# Patient Record
Sex: Female | Born: 1975 | Race: White | Hispanic: No | Marital: Married | Smoking: Never smoker
Health system: Southern US, Community
[De-identification: ages and names within clinical notes are randomized; demographics above are authoritative.]

## PROBLEM LIST (undated history)

## (undated) DIAGNOSIS — F419 Anxiety disorder, unspecified: Secondary | ICD-10-CM

## (undated) DIAGNOSIS — E079 Disorder of thyroid, unspecified: Secondary | ICD-10-CM

## (undated) DIAGNOSIS — O039 Complete or unspecified spontaneous abortion without complication: Secondary | ICD-10-CM

## (undated) DIAGNOSIS — E039 Hypothyroidism, unspecified: Secondary | ICD-10-CM

## (undated) DIAGNOSIS — D219 Benign neoplasm of connective and other soft tissue, unspecified: Secondary | ICD-10-CM

## (undated) HISTORY — DX: Anxiety disorder, unspecified: F41.9

## (undated) HISTORY — PX: UTERINE FIBROID EMBOLIZATION: SHX825

## (undated) HISTORY — DX: Complete or unspecified spontaneous abortion without complication: O03.9

## (undated) HISTORY — PX: LAPAROSCOPY: SHX197

## (undated) HISTORY — PX: MYOMECTOMY ABDOMINAL APPROACH: SUR870

## (undated) HISTORY — PX: ABDOMINAL HYSTERECTOMY: SHX81

## (undated) HISTORY — DX: Disorder of thyroid, unspecified: E07.9

## (undated) HISTORY — DX: Benign neoplasm of connective and other soft tissue, unspecified: D21.9

---

## 2005-02-27 ENCOUNTER — Ambulatory Visit: Payer: Self-pay

## 2005-12-19 ENCOUNTER — Ambulatory Visit: Payer: Self-pay

## 2006-03-18 ENCOUNTER — Encounter: Admission: RE | Admit: 2006-03-18 | Discharge: 2006-03-18 | Payer: Self-pay

## 2006-08-11 ENCOUNTER — Ambulatory Visit: Payer: Self-pay | Admitting: Family Medicine

## 2006-08-21 ENCOUNTER — Encounter: Admission: RE | Admit: 2006-08-21 | Discharge: 2006-08-21 | Payer: Self-pay | Admitting: Diagnostic Radiology

## 2007-08-19 ENCOUNTER — Ambulatory Visit: Payer: Self-pay | Admitting: Internal Medicine

## 2007-08-28 ENCOUNTER — Encounter: Admission: RE | Admit: 2007-08-28 | Discharge: 2007-08-28 | Payer: Self-pay | Admitting: Diagnostic Radiology

## 2008-02-15 ENCOUNTER — Encounter: Admission: RE | Admit: 2008-02-15 | Discharge: 2008-02-15 | Payer: Self-pay | Admitting: Diagnostic Radiology

## 2012-04-08 ENCOUNTER — Ambulatory Visit: Payer: Self-pay | Admitting: Internal Medicine

## 2013-09-30 ENCOUNTER — Encounter: Payer: Self-pay | Admitting: Maternal & Fetal Medicine

## 2013-10-06 ENCOUNTER — Ambulatory Visit: Payer: Self-pay | Admitting: Obstetrics & Gynecology

## 2013-10-06 LAB — CBC
HCT: 38.3 % (ref 35.0–47.0)
HGB: 12.9 g/dL (ref 12.0–16.0)
MCH: 32 pg (ref 26.0–34.0)
MCHC: 33.8 g/dL (ref 32.0–36.0)
MCV: 95 fL (ref 80–100)
Platelet: 239 10*3/uL (ref 150–440)
RBC: 4.04 10*6/uL (ref 3.80–5.20)
RDW: 13.5 % (ref 11.5–14.5)
WBC: 11.4 10*3/uL — AB (ref 3.6–11.0)

## 2014-04-20 LAB — HM PAP SMEAR: HM PAP: NEGATIVE

## 2014-10-29 NOTE — Consult Note (Signed)
Referral Information:  Reason for Referral 39 yoG2P0010 EDD 05/01/14 at 9w 4 days by ultrasound performed at [facility] on 09/08/13;measurements were consistent with 6w 3 days.  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   1. AMA--we addressed risks for aneuploidy and options for testing including invasive testing and cell free fetal DNA screening.  She had plans to undergo cell free fetal DNA screening in your office.  2. Hypothyroidism--we addressed the trophic nature of thyroid hormones and their role in fetal brain and cognitive development.  Recommend monitoring levels each trimester or one month after adjusting dose.  3. 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  Uterine artery ablation has been associated with miscarriage,  abnormal placentation and risk for postpartum  hemorrhage.   Recommendations I would recommend a follow up ultrasound at ~29-30 weeks to assess the placenta, it may be difficult to identify focal areas of invasion, however, this evaluation may provide reassurance if Nitabach's layer is clearly identfied.   Plan:  Prenatal Diagnosis Options Level II [place]   Ultrasound at what gestational ages Monthly > 28 weeks    Total Time Spent with Patient 45 minutes   >50% of visit spent in couseling/coordination of care yes   Office Use Only [address]  Level 3 (4min) NEW office consult detailed   Coding Description: MATERNAL CONDITIONS/HISTORY INDICATION(S).   Adv Maternal Age- primigravida.   Fibroids.  Electronic Signatures: Manfred Shirts (MD)  (Signed 26-Mar-15 11:56)  Authored: Referral, Home Medications, Allergies, Consult, Exam, Impression, Plan, Billing, Coding Description   Last Updated: 26-Mar-15 11:56 by Manfred Shirts (MD)

## 2016-06-03 ENCOUNTER — Other Ambulatory Visit: Payer: Self-pay | Admitting: Obstetrics & Gynecology

## 2016-06-03 DIAGNOSIS — Z1239 Encounter for other screening for malignant neoplasm of breast: Secondary | ICD-10-CM

## 2016-06-07 ENCOUNTER — Encounter: Payer: Self-pay | Admitting: Radiology

## 2016-06-07 ENCOUNTER — Ambulatory Visit
Admission: RE | Admit: 2016-06-07 | Discharge: 2016-06-07 | Disposition: A | Discharge status: Home / Self Care | Payer: BLUE CROSS/BLUE SHIELD | Source: Ambulatory Visit | Attending: Obstetrics & Gynecology | Admitting: Obstetrics & Gynecology

## 2016-06-07 DIAGNOSIS — Z1231 Encounter for screening mammogram for malignant neoplasm of breast: Secondary | ICD-10-CM | POA: Diagnosis present

## 2016-06-07 DIAGNOSIS — Z1239 Encounter for other screening for malignant neoplasm of breast: Secondary | ICD-10-CM

## 2016-06-07 LAB — HM MAMMOGRAPHY

## 2016-06-12 ENCOUNTER — Inpatient Hospital Stay
Admission: RE | Admit: 2016-06-12 | Discharge: 2016-06-12 | Disposition: A | Discharge status: Home / Self Care | Payer: Self-pay | Source: Ambulatory Visit | Attending: *Deleted | Admitting: *Deleted

## 2016-06-12 ENCOUNTER — Other Ambulatory Visit: Payer: Self-pay | Admitting: *Deleted

## 2016-06-12 DIAGNOSIS — Z1231 Encounter for screening mammogram for malignant neoplasm of breast: Secondary | ICD-10-CM

## 2016-07-12 ENCOUNTER — Ambulatory Visit: Payer: Self-pay

## 2017-06-04 ENCOUNTER — Ambulatory Visit (INDEPENDENT_AMBULATORY_CARE_PROVIDER_SITE_OTHER): Payer: BLUE CROSS/BLUE SHIELD | Admitting: Obstetrics & Gynecology

## 2017-06-04 ENCOUNTER — Encounter: Payer: Self-pay | Admitting: Obstetrics & Gynecology

## 2017-06-04 VITALS — BP 138/80 | Ht 66.0 in | Wt 136.0 lb

## 2017-06-04 DIAGNOSIS — Z1239 Encounter for other screening for malignant neoplasm of breast: Secondary | ICD-10-CM

## 2017-06-04 DIAGNOSIS — D219 Benign neoplasm of connective and other soft tissue, unspecified: Secondary | ICD-10-CM | POA: Diagnosis not present

## 2017-06-04 DIAGNOSIS — Z01419 Encounter for gynecological examination (general) (routine) without abnormal findings: Secondary | ICD-10-CM

## 2017-06-04 DIAGNOSIS — Z124 Encounter for screening for malignant neoplasm of cervix: Secondary | ICD-10-CM | POA: Diagnosis not present

## 2017-06-04 DIAGNOSIS — Z0000 Encounter for general adult medical examination without abnormal findings: Secondary | ICD-10-CM

## 2017-06-04 DIAGNOSIS — Z1231 Encounter for screening mammogram for malignant neoplasm of breast: Secondary | ICD-10-CM | POA: Diagnosis not present

## 2017-06-04 NOTE — Progress Notes (Signed)
HPI:      [name] is a 41 y.o. G2P0020 who LMP was No LMP recorded. Patient is not currently having periods (Reason: IUD)., she presents today for her annual examination. The patient has no complaints today. The patient is sexually active. Her last pap: approximate date 2015 and was normal and last mammogram: was normal. The patient does perform self breast exams.  There is no notable family history of breast or ovarian cancer in her family.  The patient has regular exercise: yes.  The patient denies current symptoms of depression.    GYN History: Contraception: IUD  PMHx: Past Medical History:  Diagnosis Date  . Anxiety   . Fibroids   . Spontaneous abortion    Past Surgical History:  Procedure Laterality Date  . LAPAROSCOPY    . UTERINE FIBROID EMBOLIZATION     Family History  Problem Relation Age of Onset  . COPD Father   . Breast cancer Neg Hx    Social History   Tobacco Use  . Smoking status: Never Smoker  . Smokeless tobacco: Never Used  Substance Use Topics  . Alcohol use: No    Frequency: Never  . Drug use: No    Current Outpatient Medications:  .  escitalopram (LEXAPRO) 10 MG tablet, , Disp: , Rfl: 0 .  levothyroxine (SYNTHROID, LEVOTHROID) 50 MCG tablet, , Disp: , Rfl: 0 Allergies: Promethazine  Review of Systems  Constitutional: Negative for chills, fever and malaise/fatigue.  HENT: Negative for congestion, sinus pain and sore throat.   Eyes: Negative for blurred vision and pain.  Respiratory: Negative for cough and wheezing.   Cardiovascular: Negative for chest pain and leg swelling.  Gastrointestinal: Negative for abdominal pain, constipation, diarrhea, heartburn, nausea and vomiting.  Genitourinary: Negative for dysuria, frequency, hematuria and urgency.  Musculoskeletal: Negative for back pain, joint pain, myalgias and neck pain.  Skin: Negative for itching and rash.  Neurological: Negative for dizziness, tremors and weakness.    Endo/Heme/Allergies: Does not bruise/bleed easily.  Psychiatric/Behavioral: Negative for depression. The patient is not nervous/anxious and does not have insomnia.    Objective: BP 138/80   Ht 5\' 6"  (1.676 m)   Wt 136 lb (61.7 kg)   BMI 21.95 kg/m   Filed Weights   06/04/17 1504  Weight: 136 lb (61.7 kg)   Body mass index is 21.95 kg/m. Physical Exam  Constitutional: She is oriented to person, place, and time. She appears well-developed and well-nourished. No distress.  Genitourinary: Rectum normal, vagina normal and uterus normal. Pelvic exam was performed with patient supine. There is no rash or lesion on the right labia. There is no rash or lesion on the left labia. Vagina exhibits no lesion. No bleeding in the vagina. Right adnexum does not display mass and does not display tenderness. Left adnexum does not display mass and does not display tenderness. Cervix does not exhibit motion tenderness, lesion, friability or polyp.   Uterus is mobile and midaxial. Uterus is not enlarged or exhibiting a mass.  Genitourinary Comments: Right sided fibroid 5 cm IUD strings 1 cm  HENT:  Head: Normocephalic and atraumatic. Head is without laceration.  Right Ear: Hearing normal.  Left Ear: Hearing normal.  Nose: No epistaxis.  No foreign bodies.  Mouth/Throat: Uvula is midline, oropharynx is clear and moist and mucous membranes are normal.  Eyes: Pupils are equal, round, and reactive to light.  Neck: Normal range of motion. Neck supple. No thyromegaly present.  Cardiovascular: Normal  rate and regular rhythm. Exam reveals no gallop and no friction rub.  No murmur heard. Pulmonary/Chest: Effort normal and breath sounds normal. No respiratory distress. She has no wheezes. Right breast exhibits no mass, no skin change and no tenderness. Left breast exhibits no mass, no skin change and no tenderness.  Abdominal: Soft. Bowel sounds are normal. She exhibits no distension. There is no tenderness. There  is no rebound.  Musculoskeletal: Normal range of motion.  Neurological: She is alert and oriented to person, place, and time. No cranial nerve deficit.  Skin: Skin is warm and dry.  Psychiatric: She has a normal mood and affect. Judgment normal.  Vitals reviewed.   Assessment:  ANNUAL EXAM 1. Annual physical exam   2. Screening for breast cancer   3. Screening for cervical cancer   4. Fibroid    Screening Plan:            1.  Cervical Screening-  Pap smear done today  2. Breast screening- Exam annually and mammogram>40 planned   3. Colonoscopy every 10 years, Hemoccult testing - after age 10  4. Labs managed by PCP  5. Counseling for contraception: IUD year 3, continue (no periods)  Other:  1. Annual physical exam  2. Screening for breast cancer - MM DIGITAL SCREENING BILATERAL; Future  3. Screening for cervical cancer - IGP, Aptima HPV  4. Fibroid uterus. Stable, no symptoms.    F/U  Return in about 1 year (around 06/04/2018) for Annual.  Barnett Applebaum, MD, [facility], [facility] 06/04/2017  3:57 PM

## 2017-06-04 NOTE — Patient Instructions (Signed)
PAP every three years Mammogram every year    Call [phone] to schedule at [facility] yearly (with PCP)   

## 2017-06-07 LAB — IGP, APTIMA HPV
HPV APTIMA: NEGATIVE
PAP SMEAR COMMENT: 0

## 2017-06-23 ENCOUNTER — Other Ambulatory Visit: Payer: Self-pay | Admitting: Internal Medicine

## 2017-06-24 ENCOUNTER — Ambulatory Visit
Admission: RE | Admit: 2017-06-24 | Discharge: 2017-06-24 | Disposition: A | Discharge status: Home / Self Care | Payer: BLUE CROSS/BLUE SHIELD | Source: Ambulatory Visit | Attending: Obstetrics & Gynecology | Admitting: Obstetrics & Gynecology

## 2017-06-24 DIAGNOSIS — Z1231 Encounter for screening mammogram for malignant neoplasm of breast: Secondary | ICD-10-CM | POA: Diagnosis present

## 2017-06-24 DIAGNOSIS — Z1239 Encounter for other screening for malignant neoplasm of breast: Secondary | ICD-10-CM

## 2017-12-02 ENCOUNTER — Ambulatory Visit: Payer: Self-pay | Admitting: Nurse Practitioner

## 2017-12-02 ENCOUNTER — Ambulatory Visit: Payer: BLUE CROSS/BLUE SHIELD | Admitting: Internal Medicine

## 2017-12-02 ENCOUNTER — Encounter: Payer: Self-pay | Admitting: Internal Medicine

## 2017-12-02 VITALS — BP 130/82 | HR 64 | Resp 16 | Ht 66.0 in | Wt 130.6 lb

## 2017-12-02 DIAGNOSIS — Z0001 Encounter for general adult medical examination with abnormal findings: Secondary | ICD-10-CM | POA: Diagnosis not present

## 2017-12-02 DIAGNOSIS — F411 Generalized anxiety disorder: Secondary | ICD-10-CM | POA: Diagnosis not present

## 2017-12-02 DIAGNOSIS — E039 Hypothyroidism, unspecified: Secondary | ICD-10-CM

## 2017-12-02 MED ORDER — ALPRAZOLAM 0.5 MG PO TABS: ORAL_TABLET | ORAL | 2 refills | Status: DC

## 2017-12-02 NOTE — Progress Notes (Signed)
[facility] [place], [place] [address]  Internal MEDICINE  Office Visit Note  Patient Name: Tiffany Frey  [id number]  [id number]  Date of Service: 12/02/2017  Chief Complaint  Patient presents with  . Hypothyroidism  . Anxiety    HPI  Pt is here for routine follow up. She feels well. Needs refill on her medications, she gets her pap/mammogram at obgyn. Does take Lexapro on a regular basis, takes Xanax prn    Current Medication: Outpatient Encounter Medications as of 12/02/2017  Medication Sig  . [DISCONTINUED] ALPRAZolam (XANAX) 0.5 MG tablet Take 0.5 mg by mouth. Take 1/2 to 1 tab po BID prn  . [DISCONTINUED] ALPRAZolam (XANAX) 0.5 MG tablet Take 1/2 to 1 tab po BID prn for anxiety  . ALPRAZolam (XANAX) 0.5 MG tablet Take HALF TAB PO BID PRN ANXIETY  . escitalopram (LEXAPRO) 10 MG tablet   . levothyroxine (SYNTHROID, LEVOTHROID) 50 MCG tablet TAKE ONE TABLET BY MOUTH IN THE MORNING 30  MINUTES  BEFORE  BREAKFAST   No facility-administered encounter medications on file as of 12/02/2017.     Surgical History: Past Surgical History:  Procedure Laterality Date  . LAPAROSCOPY    . UTERINE FIBROID EMBOLIZATION      Medical History: Past Medical History:  Diagnosis Date  . Anxiety   . Fibroids   . Spontaneous abortion     Family History: Family History  Problem Relation Age of Onset  . COPD Father   . Breast cancer Neg Hx     Social History   Socioeconomic History  . Marital status: Married    Spouse name: Not on file  . Number of children: Not on file  . Years of education: Not on file  . Highest education level: Not on file  Occupational History  . Not on file  Social Needs  . Financial resource strain: Not on file  . Food insecurity:    Worry: Not on file    Inability: Not on file  . Transportation needs:    Medical: Not on file    Non-medical: Not on file  Tobacco Use  . Smoking status: Never Smoker  . Smokeless  tobacco: Never Used  Substance and Sexual Activity  . Alcohol use: No    Frequency: Never  . Drug use: No  . Sexual activity: Yes    Birth control/protection: IUD  Lifestyle  . Physical activity:    Days per week: Not on file    Minutes per session: Not on file  . Stress: Not on file  Relationships  . Social connections:    Talks on phone: Not on file    Gets together: Not on file    Attends religious service: Not on file    Active member of club or organization: Not on file    Attends meetings of clubs or organizations: Not on file    Relationship status: Not on file  . Intimate partner violence:    Fear of current or ex partner: Not on file    Emotionally abused: Not on file    Physically abused: Not on file    Forced sexual activity: Not on file  Other Topics Concern  . Not on file  Social History Narrative  . Not on file    Review of Systems  Constitutional: Negative for chills.  HENT: Negative for ear pain and postnasal drip.   Eyes: Negative.  Negative for visual disturbance.  Respiratory: Negative for cough  and wheezing.   Cardiovascular: Negative for palpitations and leg swelling.  Gastrointestinal: Negative for constipation, diarrhea and nausea.  Genitourinary: Negative for dysuria and flank pain.  Musculoskeletal: Negative for back pain and neck pain.  Skin: Negative for color change.  Allergic/Immunologic: Positive for environmental allergies. Negative for food allergies.  Neurological: Negative for headaches.  Hematological: Does not bruise/bleed easily.  Psychiatric/Behavioral: Negative for agitation, behavioral problems (depression) and hallucinations.    Vital Signs: BP 130/82 (BP Location: Left Arm, Patient Position: Sitting, Cuff Size: Small)   Pulse 64   Resp 16   Ht 5\' 6"  (1.676 m)   Wt 130 lb 9.6 oz (59.2 kg)   SpO2 100%   BMI 21.08 kg/m    Physical Exam  Constitutional: She is oriented to person, place, and time. She appears  well-developed and well-nourished. No distress.  HENT:  Head: Normocephalic.  Eyes: Pupils are equal, round, and reactive to light. EOM are normal.  Cardiovascular: Normal rate and regular rhythm.  Pulmonary/Chest: Effort normal and breath sounds normal.  Neurological: She is alert and oriented to person, place, and time.  Skin: Skin is warm and dry.   Assessment/plan. 1. Hypothyroidism, unspecified type - Continue Synthroid  - TSH - T4, free  2. Generalized anxiety disorder - Refilled Xanax and Lexapro  Labs ordered  - Lipid Panel With LDL/HDL Ratio - Comprehensive metabolic panel  General Counseling: Raye verbalizes understanding of the findings of todays visit and agrees with plan of treatment. I have discussed any further diagnostic evaluation that may be needed or ordered today. We also reviewed her medications today. she has been encouraged to call the office with any questions or concerns that should arise related to todays visit.   Orders Placed This Encounter  Procedures  . Lipid Panel With LDL/HDL Ratio  . TSH  . T4, free  . Comprehensive metabolic panel    Meds ordered this encounter  Medications  . DISCONTD: ALPRAZolam (XANAX) 0.5 MG tablet    Sig: Take 1/2 to 1 tab po BID prn for anxiety    Dispense:  45 tablet    Refill:  2  . ALPRAZolam (XANAX) 0.5 MG tablet    Sig: Take HALF TAB PO BID PRN ANXIETY    Dispense:  45 tablet    Refill:  2    Time spent:20Minutes   Dr Lavera Guise Internal medicine

## 2017-12-05 LAB — COMPREHENSIVE METABOLIC PANEL
A/G RATIO: 1.9 (ref 1.2–2.2)
ALK PHOS: 57 IU/L (ref 39–117)
ALT: 12 IU/L (ref 0–32)
AST: 17 IU/L (ref 0–40)
Albumin: 4.8 g/dL (ref 3.5–5.5)
BILIRUBIN TOTAL: 0.7 mg/dL (ref 0.0–1.2)
BUN / CREAT RATIO: 16 (ref 9–23)
BUN: 17 mg/dL (ref 6–24)
CHLORIDE: 103 mmol/L (ref 96–106)
CO2: 24 mmol/L (ref 20–29)
Calcium: 9.8 mg/dL (ref 8.7–10.2)
Creatinine, Ser: 1.05 mg/dL — ABNORMAL HIGH (ref 0.57–1.00)
GFR calc non Af Amer: 65 mL/min/{1.73_m2} (ref >59)
GFR, EST AFRICAN AMERICAN: 75 mL/min/{1.73_m2} (ref >59)
GLOBULIN, TOTAL: 2.5 g/dL (ref 1.5–4.5)
Glucose: 90 mg/dL (ref 65–99)
Potassium: 5 mmol/L (ref 3.5–5.2)
SODIUM: 140 mmol/L (ref 134–144)
TOTAL PROTEIN: 7.3 g/dL (ref 6.0–8.5)

## 2017-12-05 LAB — CBC WITH DIFFERENTIAL/PLATELET
Basophils Absolute: 0 10*3/uL (ref 0.0–0.2)
Basos: 1 %
EOS (ABSOLUTE): 0.2 10*3/uL (ref 0.0–0.4)
Eos: 2 %
HEMATOCRIT: 42.2 % (ref 34.0–46.6)
HEMOGLOBIN: 14.6 g/dL (ref 11.1–15.9)
Immature Grans (Abs): 0 10*3/uL (ref 0.0–0.1)
Immature Granulocytes: 0 %
LYMPHS ABS: 1.5 10*3/uL (ref 0.7–3.1)
Lymphs: 22 %
MCH: 32.2 pg (ref 26.6–33.0)
MCHC: 34.6 g/dL (ref 31.5–35.7)
MCV: 93 fL (ref 79–97)
MONOCYTES: 9 %
MONOS ABS: 0.6 10*3/uL (ref 0.1–0.9)
NEUTROS ABS: 4.6 10*3/uL (ref 1.4–7.0)
Neutrophils: 66 %
Platelets: 270 10*3/uL (ref 150–450)
RBC: 4.53 x10E6/uL (ref 3.77–5.28)
RDW: 14.1 % (ref 12.3–15.4)
WBC: 6.9 10*3/uL (ref 3.4–10.8)

## 2017-12-05 LAB — LIPID PANEL WITH LDL/HDL RATIO
Cholesterol, Total: 166 mg/dL (ref 100–199)
HDL: 64 mg/dL (ref >39)
LDL CALC: 87 mg/dL (ref 0–99)
LDl/HDL Ratio: 1.4 ratio (ref 0.0–3.2)
TRIGLYCERIDES: 76 mg/dL (ref 0–149)
VLDL CHOLESTEROL CAL: 15 mg/dL (ref 5–40)

## 2017-12-05 LAB — T4, FREE: Free T4: 1.34 ng/dL (ref 0.82–1.77)

## 2017-12-05 LAB — TSH: TSH: 2.62 u[IU]/mL (ref 0.450–4.500)

## 2017-12-11 ENCOUNTER — Telehealth: Payer: Self-pay | Admitting: General Practice

## 2017-12-11 NOTE — Telephone Encounter (Signed)
Pt called and states that she did not hear about her lab work , and she could not see it on my chart. Told pt that I would call her back this afternoon with result

## 2017-12-11 NOTE — Telephone Encounter (Signed)
Pt advised on labs and to increase water intake

## 2018-07-06 ENCOUNTER — Telehealth: Payer: Self-pay | Admitting: Obstetrics & Gynecology

## 2018-07-06 ENCOUNTER — Ambulatory Visit (INDEPENDENT_AMBULATORY_CARE_PROVIDER_SITE_OTHER): Payer: BLUE CROSS/BLUE SHIELD | Admitting: Obstetrics & Gynecology

## 2018-07-06 ENCOUNTER — Encounter: Payer: Self-pay | Admitting: Obstetrics & Gynecology

## 2018-07-06 VITALS — BP 110/70 | Ht 66.0 in | Wt 134.0 lb

## 2018-07-06 DIAGNOSIS — Z01419 Encounter for gynecological examination (general) (routine) without abnormal findings: Secondary | ICD-10-CM | POA: Diagnosis not present

## 2018-07-06 DIAGNOSIS — D219 Benign neoplasm of connective and other soft tissue, unspecified: Secondary | ICD-10-CM

## 2018-07-06 DIAGNOSIS — Z1239 Encounter for other screening for malignant neoplasm of breast: Secondary | ICD-10-CM

## 2018-07-06 NOTE — Progress Notes (Signed)
HPI:      [name] is a 42 y.o. G2P0020 with Menstrual status: IUD (year 4+), she presents today for her annual examination. The patient has no complaints today.  She also has h/o fibroid uterus. The patient is sexually active. Her last pap: approximate date 2018 and was normal and last mammogram: approximate date 2018 and was normal. The patient does perform self breast exams.  There is no notable family history of breast or ovarian cancer in her family.  The patient has regular exercise: yes.  The patient denies current symptoms of depression.    GYN History: Contraception: IUD  PMHx: Past Medical History:  Diagnosis Date  . Anxiety   . Fibroids   . Spontaneous abortion    Past Surgical History:  Procedure Laterality Date  . LAPAROSCOPY    . UTERINE FIBROID EMBOLIZATION     Family History  Problem Relation Age of Onset  . COPD Father   . Breast cancer Neg Hx    Social History   Tobacco Use  . Smoking status: Never Smoker  . Smokeless tobacco: Never Used  Substance Use Topics  . Alcohol use: No    Frequency: Never  . Drug use: No    Current Outpatient Medications:  .  ALPRAZolam (XANAX) 0.5 MG tablet, Take HALF TAB PO BID PRN ANXIETY, Disp: 45 tablet, Rfl: 2 .  escitalopram (LEXAPRO) 10 MG tablet, , Disp: , Rfl: 0 .  levonorgestrel (MIRENA) 20 MCG/24HR IUD, 1 each by Intrauterine route once., Disp: , Rfl:  .  levothyroxine (SYNTHROID, LEVOTHROID) 50 MCG tablet, TAKE ONE TABLET BY MOUTH IN THE MORNING 30  MINUTES  BEFORE  BREAKFAST, Disp: 90 tablet, Rfl: 3 Allergies: Promethazine  Review of Systems  Constitutional: Negative for chills, fever and malaise/fatigue.  HENT: Negative for congestion, sinus pain and sore throat.   Eyes: Negative for blurred vision and pain.  Respiratory: Negative for cough and wheezing.   Cardiovascular: Negative for chest pain and leg swelling.  Gastrointestinal: Negative for abdominal pain, constipation, diarrhea, heartburn,  nausea and vomiting.  Genitourinary: Negative for dysuria, frequency, hematuria and urgency.  Musculoskeletal: Negative for back pain, joint pain, myalgias and neck pain.  Skin: Negative for itching and rash.  Neurological: Negative for dizziness, tremors and weakness.  Endo/Heme/Allergies: Does not bruise/bleed easily.  Psychiatric/Behavioral: Negative for depression. The patient is not nervous/anxious and does not have insomnia.    Objective: BP 110/70   Ht 5\' 6"  (1.676 m)   Wt 134 lb (60.8 kg)   BMI 21.63 kg/m   Filed Weights   07/06/18 0806  Weight: 134 lb (60.8 kg)   Body mass index is 21.63 kg/m. Physical Exam Constitutional:      General: She is not in acute distress.    Appearance: She is well-developed.  Genitourinary:     Pelvic exam was performed with patient supine.     Vagina and rectum normal.     No lesions in the vagina.     No vaginal bleeding.     No cervical motion tenderness, friability, lesion or polyp.     Uterus is mobile.     Uterus is not enlarged.     Uterine mass present.    Uterus is midaxial.     No right or left adnexal mass present.     Right adnexa not tender.     Left adnexa not tender.     Genitourinary Comments: Strings 2 cm Palpables small fibroid  right sided  HENT:     Head: Normocephalic and atraumatic. No laceration.     Right Ear: Hearing normal.     Left Ear: Hearing normal.     Mouth/Throat:     Pharynx: Uvula midline.  Eyes:     Pupils: Pupils are equal, round, and reactive to light.  Neck:     Musculoskeletal: Normal range of motion and neck supple.     Thyroid: No thyromegaly.  Cardiovascular:     Rate and Rhythm: Normal rate and regular rhythm.     Heart sounds: No murmur. No friction rub. No gallop.   Pulmonary:     Effort: Pulmonary effort is normal. No respiratory distress.     Breath sounds: Normal breath sounds. No wheezing.  Chest:     Breasts:        Right: No mass, skin change or tenderness.        Left:  No mass, skin change or tenderness.  Abdominal:     General: Bowel sounds are normal. There is no distension.     Palpations: Abdomen is soft.     Tenderness: There is no abdominal tenderness. There is no rebound.  Musculoskeletal: Normal range of motion.  Neurological:     Mental Status: She is alert and oriented to person, place, and time.     Cranial Nerves: No cranial nerve deficit.  Skin:    General: Skin is warm and dry.  Psychiatric:        Judgment: Judgment normal.  Vitals signs reviewed.   Assessment:  ANNUAL EXAM 1. Women's annual routine gynecological examination   2. Screening for breast cancer   3. Fibroid    Screening Plan:            1.  Cervical Screening-  Pap smear schedule reviewed with patient  2. Breast screening- Exam annually and mammogram>40 planned   3. Colonoscopy every 10 years, Hemoccult testing - after age 47  4. Labs managed by PCP  5. Counseling for contraception: IUD, year 4+    F/U  Return in about 4 months (around 11/05/2018) for Follow up for IUD EXCHANGE.  Barnett Applebaum, MD, [facility], [facility] 07/06/2018  8:26 AM

## 2018-07-06 NOTE — Telephone Encounter (Signed)
Patient scheduled for Mirena replacement 11/09/18 with [facility]

## 2018-07-06 NOTE — Patient Instructions (Signed)
PAP every three years Mammogram every year    Call [phone] to schedule at [facility] yearly (with PCP)   

## 2018-07-16 ENCOUNTER — Other Ambulatory Visit: Payer: Self-pay

## 2018-07-16 MED ORDER — LEVOTHYROXINE SODIUM 50 MCG PO TABS: ORAL_TABLET | ORAL | 1 refills | Status: DC

## 2018-07-24 ENCOUNTER — Ambulatory Visit
Admission: RE | Admit: 2018-07-24 | Discharge: 2018-07-24 | Disposition: A | Discharge status: Home / Self Care | Payer: PRIVATE HEALTH INSURANCE | Source: Ambulatory Visit | Attending: Obstetrics & Gynecology | Admitting: Obstetrics & Gynecology

## 2018-07-24 DIAGNOSIS — Z1239 Encounter for other screening for malignant neoplasm of breast: Secondary | ICD-10-CM | POA: Insufficient documentation

## 2018-08-14 ENCOUNTER — Ambulatory Visit (INDEPENDENT_AMBULATORY_CARE_PROVIDER_SITE_OTHER): Payer: PRIVATE HEALTH INSURANCE | Admitting: Nurse Practitioner

## 2018-08-14 VITALS — BP 133/66 | HR 67 | Temp 98.1°F | Ht 65.0 in | Wt 138.0 lb

## 2018-08-14 DIAGNOSIS — F419 Anxiety disorder, unspecified: Secondary | ICD-10-CM | POA: Diagnosis not present

## 2018-08-14 DIAGNOSIS — B353 Tinea pedis: Secondary | ICD-10-CM

## 2018-08-14 DIAGNOSIS — Z7689 Persons encountering health services in other specified circumstances: Secondary | ICD-10-CM

## 2018-08-14 DIAGNOSIS — E039 Hypothyroidism, unspecified: Secondary | ICD-10-CM | POA: Diagnosis not present

## 2018-08-17 ENCOUNTER — Encounter: Payer: Self-pay | Admitting: Nurse Practitioner

## 2018-08-17 DIAGNOSIS — F419 Anxiety disorder, unspecified: Secondary | ICD-10-CM | POA: Insufficient documentation

## 2018-08-17 DIAGNOSIS — E039 Hypothyroidism, unspecified: Secondary | ICD-10-CM | POA: Insufficient documentation

## 2018-08-17 MED ORDER — HYDROXYZINE HCL 10 MG PO TABS: 10.0000 mg | ORAL_TABLET | Freq: Three times a day (TID) | ORAL | 0 refills | Status: DC | PRN

## 2018-08-17 NOTE — Patient Instructions (Addendum)
Tiffany Frey,   Thank you for coming in to clinic today.  Please schedule a follow-up appointment with Cassell Smiles, AGNP. Return in about 6 months (around 02/12/2019) for annual physical AND 1 year anxiety, thyroid.  If you have any other questions or concerns, please feel free to call the clinic or send a message through [place] [facility]. You may also schedule an earlier appointment if necessary.  You will receive a survey after today's visit either digitally by e-mail or paper by [employer]. Your experiences and feedback matter to [place].  Please respond so we know how we are doing as we provide care for you.   Cassell Smiles, DNP, AGNP-BC Adult Gerontology Nurse Practitioner [place]

## 2018-08-17 NOTE — Progress Notes (Signed)
Subjective:    Patient ID: Tiffany Frey, female    DOB: January 23, 1976, 43 y.o.   MRN: [id number]  Tiffany Frey is a 43 y.o. female presenting on 08/14/2018 for No chief complaint on file.   HPI Establish Care New Provider Pt last seen by PCP Dr. Humphrey Rolls at [facility] about 1 year ago.  Obtain records for last 2 years. Patient is changing due to provider no longer in health insurance network.  Anxiety Patient continues to take escitalopram 10 mg tablet  (1/2 tab for 5 mg total) once daily in evening.  She also has used alprazolam 0.5 mg tab (1/2 tab for 0.25 mg total) prn for panic.  Most often happens when riding in a car.  Patient has other generalized anxiousness/nervousness that does not normally effect daily life.  Feels controlled on current dose of escitalopram.  Patient has not use alprazolam recently.  Is willing to try alternative medication for panic.  Thyroid Patient has hypothyroidism.  Last labs 09/2017.  Takes levothyrxine 50 mcg once daily in am on an empty stomach. - She denies, fatigue, excess energy, weight changes, heart racing, heart palpitations, heat and cold intolerance, changes in hair/skin/nails, and lower leg swelling.  - She does not have any compressive symptoms to include difficulty swallowing, globus sensation, or difficulty breathing when lying flat.   Tinea pedis and fungal nail infection Patient has had ketoconazole cream for tinea pedis of foot.  Managed by Dr. Nehemiah Massed.  Uses ketoconazole 2-3 days per week. Continues to use Kerydin 5% nail treatment daily and will finish in March.  Patient notes nearly full resolution but continues treatment for full year.  Depression screen [facility]/9 08/17/2018 12/02/2017  Decreased Interest 0 0  Down, Depressed, Hopeless 0 0  PHQ - 2 Score 0 0  Altered sleeping 0 -  Tired, decreased energy 0 -  Change in appetite 0 -  Feeling bad or failure about yourself  0 -  Trouble concentrating 0 -  Moving slowly or  fidgety/restless 0 -  Suicidal thoughts 0 -  PHQ-9 Score 0 -  Difficult doing work/chores Not difficult at all -    Social History   Tobacco Use  . Smoking status: Never Smoker  . Smokeless tobacco: Never Used  Substance Use Topics  . Alcohol use: No    Frequency: Never  . Drug use: No    Review of Systems  Constitutional: Negative for chills and fever.  HENT: Negative for congestion and sore throat.   Eyes: Negative for pain.  Respiratory: Negative for cough, shortness of breath and wheezing.   Cardiovascular: Negative for chest pain, palpitations and leg swelling.  Gastrointestinal: Negative for abdominal pain, blood in stool, constipation, diarrhea, nausea and vomiting.  Endocrine: Negative for polydipsia.  Genitourinary: Negative for dysuria, frequency, hematuria and urgency.  Musculoskeletal: Negative for back pain, myalgias and neck pain.  Skin: Negative.  Negative for rash.  Allergic/Immunologic: Negative for environmental allergies.  Neurological: Negative for dizziness, weakness and headaches.  Hematological: Does not bruise/bleed easily.  Psychiatric/Behavioral: Negative for dysphoric mood and suicidal ideas. The patient is nervous/anxious.    Per HPI unless specifically indicated above     Objective:    There were no vitals taken for this visit.  Wt Readings from Last 3 Encounters:  07/06/18 134 lb (60.8 kg)  12/02/17 130 lb 9.6 oz (59.2 kg)  06/04/17 136 lb (61.7 kg)    Physical Exam Vitals signs reviewed.  Constitutional:  General: She is not in acute distress.    Appearance: She is well-developed.  HENT:     Head: Normocephalic and atraumatic.     Mouth/Throat:     Mouth: Mucous membranes are moist.     Pharynx: Oropharynx is clear.  Eyes:     Extraocular Movements: Extraocular movements intact.     Conjunctiva/sclera: Conjunctivae normal.     Pupils: Pupils are equal, round, and reactive to light.  Neck:     Musculoskeletal: Normal range of  motion and neck supple.     Thyroid: Thyroid mass, thyromegaly and thyroid tenderness present.  Cardiovascular:     Rate and Rhythm: Normal rate and regular rhythm.     Pulses: Normal pulses.          Radial pulses are 2+ on the right side and 2+ on the left side.       Posterior tibial pulses are 2+ on the right side and 2+ on the left side.     Heart sounds: Normal heart sounds, S1 normal and S2 normal.  Pulmonary:     Effort: Pulmonary effort is normal. No respiratory distress.     Breath sounds: Normal breath sounds and air entry.  Musculoskeletal:     Right lower leg: No edema.     Left lower leg: No edema.  Skin:    General: Skin is warm and dry.     Capillary Refill: Capillary refill takes less than 2 seconds.  Neurological:     General: No focal deficit present.     Mental Status: She is alert and oriented to person, place, and time. Mental status is at baseline.  Psychiatric:        Attention and Perception: Attention normal.        Mood and Affect: Affect normal. Mood is anxious.        Behavior: Behavior normal. Behavior is cooperative.        Thought Content: Thought content normal.        Judgment: Judgment normal.      Results for orders placed or performed in visit on 12/02/17  Lipid Panel With LDL/HDL Ratio  Result Value Ref Range   Cholesterol, Total 166 100 - 199 mg/dL   Triglycerides 76 0 - 149 mg/dL   HDL 64 >39 mg/dL   VLDL Cholesterol Cal 15 5 - 40 mg/dL   LDL Calculated 87 0 - 99 mg/dL   LDl/HDL Ratio 1.4 0.0 - 3.2 ratio  TSH  Result Value Ref Range   TSH 2.620 0.450 - 4.500 uIU/mL  T4, free  Result Value Ref Range   Free T4 1.34 0.82 - 1.77 ng/dL  Comprehensive metabolic panel  Result Value Ref Range   Glucose 90 65 - 99 mg/dL   BUN 17 6 - 24 mg/dL   Creatinine, Ser 1.05 (H) 0.57 - 1.00 mg/dL   GFR calc non Af Amer 65 >59 mL/min/1.73   GFR calc Af Amer 75 >59 mL/min/1.73   BUN/Creatinine Ratio 16 9 - 23   Sodium 140 134 - 144 mmol/L    Potassium 5.0 3.5 - 5.2 mmol/L   Chloride 103 96 - 106 mmol/L   CO2 24 20 - 29 mmol/L   Calcium 9.8 8.7 - 10.2 mg/dL   Total Protein 7.3 6.0 - 8.5 g/dL   Albumin 4.8 3.5 - 5.5 g/dL   Globulin, Total 2.5 1.5 - 4.5 g/dL   Albumin/Globulin Ratio 1.9 1.2 - 2.2   Bilirubin Total 0.7  0.0 - 1.2 mg/dL   Alkaline Phosphatase 57 39 - 117 IU/L   AST 17 0 - 40 IU/L   ALT 12 0 - 32 IU/L  CBC with Differential/Platelet  Result Value Ref Range   WBC 6.9 3.4 - 10.8 x10E3/uL   RBC 4.53 3.77 - 5.28 x10E6/uL   Hemoglobin 14.6 11.1 - 15.9 g/dL   Hematocrit 42.2 34.0 - 46.6 %   MCV 93 79 - 97 fL   MCH 32.2 26.6 - 33.0 pg   MCHC 34.6 31.5 - 35.7 g/dL   RDW 14.1 12.3 - 15.4 %   Platelets 270 150 - 450 x10E3/uL   Neutrophils 66 Not Estab. %   Lymphs 22 Not Estab. %   Monocytes 9 Not Estab. %   Eos 2 Not Estab. %   Basos 1 Not Estab. %   Neutrophils Absolute 4.6 1.4 - 7.0 x10E3/uL   Lymphocytes Absolute 1.5 0.7 - 3.1 x10E3/uL   Monocytes Absolute 0.6 0.1 - 0.9 x10E3/uL   EOS (ABSOLUTE) 0.2 0.0 - 0.4 x10E3/uL   Basophils Absolute 0.0 0.0 - 0.2 x10E3/uL   Immature Granulocytes 0 Not Estab. %   Immature Grans (Abs) 0.0 0.0 - 0.1 x10E3/uL      Assessment & Plan:   Problem List Items Addressed This Visit      Endocrine   Hypothyroidism Currently and previously stable.  No complications noted on exam today.  Needs labs. TSH ordered today.  Continue current dose of levothyroxine without changes until labs. Follow-up 6 months with labs again at annual physical.     Other   Anxiety - Primary Patient with known GAD.  Uses rescue for panic attack rarely.  Controlled on escitalopram 5 mg once daily and has good access to coping skills usually (except in car while riding - not driving).  Plan: 1. Continue escitalopram 5 mg once daily. 2. STOP alprazolam 3. START hydroxyzine 10-20 mg prn tid anxiety/panic.  Cautioned drowsiness 4. Follow-up 6 months.   Relevant Medications   hydrOXYzine  (ATARAX/VISTARIL) 10 MG tablet    Other Visit Diagnoses    Encounter to establish care     Previous PCP was at Dr. Humphrey Rolls ([place]).  Records will be requested.  Past medical, family, and surgical history reviewed w/ pt.     Tinea pedis of right foot     Resolving.  Complete course of treatment per Dr. Nehemiah Massed.  Follow-up prn.   Relevant Medications   ketoconazole (NIZORAL) 2 % cream   KERYDIN 5 % SOLN      Meds ordered this encounter  Medications  . hydrOXYzine (ATARAX/VISTARIL) 10 MG tablet    Sig: Take 1-2 tablets (10-20 mg total) by mouth 3 (three) times daily as needed for anxiety.    Dispense:  30 tablet    Refill:  0    Order Specific Question:   Supervising Provider    Answer:   Olin Hauser [[id number]]    Follow up plan: Return in about 6 months (around 02/12/2019) for annual physical AND 1 year anxiety, thyroid.  Cassell Smiles, DNP, AGPCNP-BC Adult Gerontology Primary Care Nurse Practitioner [facility] 08/17/2018, 11:36 AM

## 2018-08-20 ENCOUNTER — Other Ambulatory Visit: Payer: Self-pay | Admitting: Nurse Practitioner

## 2018-08-20 DIAGNOSIS — E039 Hypothyroidism, unspecified: Secondary | ICD-10-CM

## 2018-08-20 LAB — TSH: TSH: 1.45 mIU/L

## 2018-08-20 MED ORDER — LEVOTHYROXINE SODIUM 50 MCG PO TABS: ORAL_TABLET | ORAL | 1 refills | Status: DC

## 2018-09-22 ENCOUNTER — Other Ambulatory Visit: Payer: Self-pay | Admitting: Internal Medicine

## 2018-09-23 ENCOUNTER — Other Ambulatory Visit: Payer: Self-pay | Admitting: Internal Medicine

## 2018-09-25 ENCOUNTER — Other Ambulatory Visit: Payer: Self-pay | Admitting: Nurse Practitioner

## 2018-09-25 DIAGNOSIS — F419 Anxiety disorder, unspecified: Secondary | ICD-10-CM

## 2018-09-25 MED ORDER — ESCITALOPRAM OXALATE 10 MG PO TABS: 5.0000 mg | ORAL_TABLET | Freq: Every day | ORAL | 1 refills | Status: DC

## 2018-09-25 NOTE — Telephone Encounter (Signed)
Pt needs a 90 day refill on escitalopram sent to [facility].  Her call back [phone]

## 2018-10-08 NOTE — Telephone Encounter (Signed)
Patient is reschedule to 11/12/18 with [facility]

## 2018-11-09 ENCOUNTER — Ambulatory Visit: Payer: Self-pay | Admitting: Obstetrics & Gynecology

## 2018-11-12 ENCOUNTER — Encounter: Payer: Self-pay | Admitting: Obstetrics & Gynecology

## 2018-11-12 ENCOUNTER — Ambulatory Visit (INDEPENDENT_AMBULATORY_CARE_PROVIDER_SITE_OTHER): Payer: PRIVATE HEALTH INSURANCE | Admitting: Obstetrics & Gynecology

## 2018-11-12 ENCOUNTER — Other Ambulatory Visit: Payer: Self-pay

## 2018-11-12 VITALS — BP 120/80 | Ht 66.0 in | Wt 139.0 lb

## 2018-11-12 DIAGNOSIS — Z30433 Encounter for removal and reinsertion of intrauterine contraceptive device: Secondary | ICD-10-CM

## 2018-11-12 NOTE — Progress Notes (Signed)
  History of Present Illness:  Tiffany Frey is a 43 y.o. that had a Mirena IUD placed approximately 5 years ago. Since that time, she states that no periods and good birth control; wants an exchange.  The following portions of the patient's history were reviewed and updated as appropriate: allergies, current medications, past family history, past medical history, past social history, past surgical history and problem list.  Patient Active Problem List   Diagnosis Date Noted  . Anxiety 08/17/2018  . Hypothyroidism 08/17/2018  . Fibroid 06/04/2017   Medications:  Current Outpatient Medications on File Prior to Visit  Medication Sig Dispense Refill  . ALPRAZolam (XANAX) 0.5 MG tablet Take HALF TAB PO BID PRN ANXIETY 45 tablet 2  . escitalopram (LEXAPRO) 10 MG tablet Take 0.5 tablets (5 mg total) by mouth daily. 45 tablet 1  . hydrOXYzine (ATARAX/VISTARIL) 10 MG tablet Take 1-2 tablets (10-20 mg total) by mouth 3 (three) times daily as needed for anxiety. 30 tablet 0  . KERYDIN 5 % SOLN Apply 1 application topically daily.    Marland Kitchen ketoconazole (NIZORAL) 2 % cream Apply 1 application topically every other day.    . levonorgestrel (MIRENA) 20 MCG/24HR IUD 1 each by Intrauterine route once.    Marland Kitchen levothyroxine (SYNTHROID, LEVOTHROID) 50 MCG tablet TAKE ONE TABLET BY MOUTH IN THE MORNING 30  MINUTES  BEFORE  BREAKFAST 90 tablet 1   No current facility-administered medications on file prior to visit.    Allergies: is allergic to promethazine.  Physical Exam:  BP 120/80   Ht 5\' 6"  (1.676 m)   Wt 139 lb (63 kg)   BMI 22.44 kg/m  Body mass index is 22.44 kg/m. Constitutional: Well nourished, well developed female in no acute distress.  Abdomen: diffusely non tender to palpation, non distended, and no masses, hernias Neuro: Grossly intact Psych:  Normal mood and affect.    Pelvic exam:  Two IUD strings present seen coming from the cervical os. EGBUS, vaginal vault and cervix: within normal  limits  IUD Removal Strings of IUD identified and grasped.  IUD removed without problem.  Pt tolerated this well.  IUD noted to be intact.  IUD Insertion Procedure Note Patient identified, informed consent performed, consent signed.   Discussed risks of irregular bleeding, cramping, infection, malpositioning or misplacement of the IUD outside the uterus which may require further procedure such as laparoscopy, risk of failure <1%. Time out was performed.    A bimanual exam showed the uterus to be midposition.  Speculum placed in the vagina.  Cervix visualized.  Cleaned with Betadine x 2.  Grasped anteriorly with a single tooth tenaculum.  Uterus sounded to 8 cm.   IUD placed per manufacturer's recommendations.  Strings trimmed to 3 cm. Tenaculum was removed, good hemostasis noted.  Patient tolerated procedure well.   Patient was given post-procedure instructions.  She was advised to have backup contraception for one week.  Patient was also asked to check IUD strings periodically and follow up in 4 weeks for IUD check.  Barnett Applebaum, MD, [facility], [facility] 11/12/2018  2:49 PM

## 2018-11-12 NOTE — Patient Instructions (Signed)
Intrauterine Device Insertion, Care After    This sheet gives you information about how to care for yourself after your procedure. Your health care provider may also give you more specific instructions. If you have problems or questions, contact your health care provider.  What can I expect after the procedure?  After the procedure, it is common to have:  · Cramps and pain in the abdomen.  · Light bleeding (spotting) or heavier bleeding that is like your menstrual period. This may last for up to a few days.  · Lower back pain.  · Dizziness.  · Headaches.  · Nausea.  Follow these instructions at home:  · Before resuming sexual activity, check to make sure that you can feel the IUD string(s). You should be able to feel the end of the string(s) below the opening of your cervix. If your IUD string is in place, you may resume sexual activity.  ? If you had a hormonal IUD inserted more than 7 days after your most recent period started, you will need to use a backup method of birth control for 7 days after IUD insertion. Ask your health care provider whether this applies to you.  · Continue to check that the IUD is still in place by feeling for the string(s) after every menstrual period, or once a month.  · Take over-the-counter and prescription medicines only as told by your health care provider.  · Do not drive or use heavy machinery while taking prescription pain medicine.  · Keep all follow-up visits as told by your health care provider. This is important.  Contact a health care provider if:  · You have bleeding that is heavier or lasts longer than a normal menstrual cycle.  · You have a fever.  · You have cramps or abdominal pain that get worse or do not get better with medicine.  · You develop abdominal pain that is new or is not in the same area of earlier cramping and pain.  · You feel lightheaded or weak.  · You have abnormal or bad-smelling discharge from your vagina.  · You have pain during sexual  activity.  · You have any of the following problems with your IUD string(s):  ? The string bothers or hurts you or your sexual partner.  ? You cannot feel the string.  ? The string has gotten longer.  · You can feel the IUD in your vagina.  · You think you may be pregnant, or you miss your menstrual period.  · You think you may have an STI (sexually transmitted infection).  Get help right away if:  · You have flu-like symptoms.  · You have a fever and chills.  · You can feel that your IUD has slipped out of place.  Summary  · After the procedure, it is common to have cramps and pain in the abdomen. It is also common to have light bleeding (spotting) or heavier bleeding that is like your menstrual period.  · Continue to check that the IUD is still in place by feeling for the string(s) after every menstrual period, or once a month.  · Keep all follow-up visits as told by your health care provider. This is important.  · Contact your health care provider if you have problems with your IUD string(s), such as the string getting longer or bothering you or your sexual partner.  This information is not intended to replace advice given to you by your health care provider. Make   sure you discuss any questions you have with your health care provider.  Document Released: 02/20/2011 Document Revised: 05/15/2016 Document Reviewed: 05/15/2016  Elsevier Interactive Patient Education © 2019 [employer] Inc[employer]

## 2018-11-18 DIAGNOSIS — Z86018 Personal history of other benign neoplasm: Secondary | ICD-10-CM

## 2018-11-18 HISTORY — DX: Personal history of other benign neoplasm: Z86.018

## 2018-12-01 ENCOUNTER — Encounter: Payer: Self-pay | Admitting: Internal Medicine

## 2018-12-08 ENCOUNTER — Encounter: Payer: Self-pay | Admitting: Internal Medicine

## 2018-12-21 ENCOUNTER — Other Ambulatory Visit: Payer: Self-pay

## 2018-12-21 ENCOUNTER — Encounter: Payer: Self-pay | Admitting: Obstetrics & Gynecology

## 2018-12-21 ENCOUNTER — Ambulatory Visit (INDEPENDENT_AMBULATORY_CARE_PROVIDER_SITE_OTHER): Payer: PRIVATE HEALTH INSURANCE | Admitting: Obstetrics & Gynecology

## 2018-12-21 VITALS — BP 110/70 | Ht 66.0 in | Wt 139.0 lb

## 2018-12-21 DIAGNOSIS — Z30431 Encounter for routine checking of intrauterine contraceptive device: Secondary | ICD-10-CM | POA: Diagnosis not present

## 2018-12-21 NOTE — Progress Notes (Signed)
  History of Present Illness:  Tiffany Frey is a 43 y.o. that had a Mirena IUD placed approximately 4 weeks ago. Since that time, she states that she has had no bleeding and pain  PMHx: She  has a past medical history of Anxiety, Fibroids, dysplastic nevus (11/18/2018), Spontaneous abortion, and Thyroid disease. Also,  has a past surgical history that includes laparoscopy and Uterine fibroid embolization., family history includes COPD in her father; Healthy in her mother; Heart attack (age of onset: 1) in her paternal grandfather.,  reports that she has never smoked. She has never used smokeless tobacco. She reports current alcohol use of about 1.0 - 2.0 standard drinks of alcohol per week. She reports that she does not use drugs. No outpatient medications have been marked as taking for the 12/21/18 encounter (Office Visit) with Gae Dry, MD.  .  Also, is allergic to promethazine..  Review of Systems  All other systems reviewed and are negative.   Physical Exam:  BP 110/70   Ht 5\' 6"  (1.676 m)   Wt 139 lb (63 kg)   BMI 22.44 kg/m  Body mass index is 22.44 kg/m. Constitutional: Well nourished, well developed female in no acute distress.  Abdomen: diffusely non tender to palpation, non distended, and no masses, hernias Neuro: Grossly intact Psych:  Normal mood and affect.    Pelvic exam:  Two IUD strings present seen coming from the cervical os. EGBUS, vaginal vault and cervix: within normal limits  Assessment: IUD strings present in proper location; pt doing well  Plan: She was told to continue to use barrier contraception, in order to prevent any STIs, and to take a home pregnancy test or call us if she ever thinks she may be pregnant, and that her IUD expires in 5 years.  She was amenable to this plan and we will see her back in 1 year/PRN.  A total of 15 minutes were spent face-to-face with the patient during this encounter and over half of that time dealt with  counseling and coordination of care.  Barnett Applebaum, MD, [facility], [facility] 12/21/2018  2:41 PM

## 2019-01-13 ENCOUNTER — Telehealth: Payer: Self-pay | Admitting: General Practice

## 2019-01-13 DIAGNOSIS — E039 Hypothyroidism, unspecified: Secondary | ICD-10-CM

## 2019-01-13 MED ORDER — LEVOTHYROXINE SODIUM 50 MCG PO TABS: ORAL_TABLET | ORAL | 0 refills | Status: DC

## 2019-01-13 NOTE — Telephone Encounter (Signed)
Pt called requesting refill on Levothroid called into [facility].

## 2019-02-23 ENCOUNTER — Encounter: Payer: PRIVATE HEALTH INSURANCE | Admitting: Nurse Practitioner

## 2019-03-04 ENCOUNTER — Other Ambulatory Visit: Payer: Self-pay | Admitting: Nurse Practitioner

## 2019-03-04 ENCOUNTER — Ambulatory Visit (INDEPENDENT_AMBULATORY_CARE_PROVIDER_SITE_OTHER): Payer: PRIVATE HEALTH INSURANCE | Admitting: Nurse Practitioner

## 2019-03-04 ENCOUNTER — Encounter: Payer: Self-pay | Admitting: Nurse Practitioner

## 2019-03-04 ENCOUNTER — Other Ambulatory Visit: Payer: Self-pay

## 2019-03-04 VITALS — BP 125/68 | HR 70 | Ht 66.0 in | Wt 140.2 lb

## 2019-03-04 DIAGNOSIS — F419 Anxiety disorder, unspecified: Secondary | ICD-10-CM

## 2019-03-04 DIAGNOSIS — Z114 Encounter for screening for human immunodeficiency virus [HIV]: Secondary | ICD-10-CM

## 2019-03-04 DIAGNOSIS — Z0000 Encounter for general adult medical examination without abnormal findings: Secondary | ICD-10-CM

## 2019-03-04 DIAGNOSIS — E039 Hypothyroidism, unspecified: Secondary | ICD-10-CM

## 2019-03-04 MED ORDER — ESCITALOPRAM OXALATE 10 MG PO TABS: 5.0000 mg | ORAL_TABLET | Freq: Every day | ORAL | 1 refills | Status: DC

## 2019-03-04 MED ORDER — LEVOTHYROXINE SODIUM 50 MCG PO TABS: ORAL_TABLET | ORAL | 1 refills | Status: DC

## 2019-03-04 NOTE — Patient Instructions (Addendum)
Tiffany Frey,   Thank you for coming in to clinic today.  1. Bring back a focus on regular exercise and healthy eating  2. You will be due for [place].  This means you should eat no food or drink after midnight.  Drink only water or coffee without cream/sugar on the morning of your lab visit. - Please go ahead and schedule a "Lab Only" visit in the morning at the clinic for lab draw in the next 7 days. - Your results will be available about 2-3 days after blood draw.  If you have set up a MyChart account, you can can log in to MyChart online to view your results and a brief explanation. Also, we can discuss your results together at your next office visit if you would like.  Please schedule a follow-up appointment with Cassell Smiles, AGNP. Return in about 1 year (around 03/03/2020) for annual physical AND 6 months for thyroid.  If you have any other questions or concerns, please feel free to call the clinic or send a message through [facility]. You may also schedule an earlier appointment if necessary.  You will receive a survey after today's visit either digitally by e-mail or paper by [employer]. Your experiences and feedback matter to [place].  Please respond so we know how we are doing as we provide care for you.   Cassell Smiles, DNP, AGNP-BC Adult Gerontology Nurse Practitioner [facility]

## 2019-03-04 NOTE — Progress Notes (Signed)
Subjective:    Patient ID: Tiffany Frey, female    DOB: 1976/05/14, 43 y.o.   MRN: [id number]  Tiffany Frey is a 43 y.o. female presenting on 03/04/2019 for Annual Exam   HPI Annual Physical Exam Patient has been feeling well.  They have no acute concerns today. Sleeps 8-9 hours per night uninterrupted.  HEALTH MAINTENANCE: Weight/BMI: healthy Physical activity: some activity weekly Diet: Regular, healthy Seatbelt: always Sunscreen: regular PAP: 06/2018 Mammogram: Jan 2020 HIV: never Optometry: regular Dentistry: regular  VACCINES: Tetanus: up to date Influenza: recommended, usually declines  Past Medical History:  Diagnosis Date  . Anxiety   . Fibroids   . Hx of dysplastic nevus 11/18/2018   L low back paraspinal above sacral area  . Spontaneous abortion   . Thyroid disease    Past Surgical History:  Procedure Laterality Date  . LAPAROSCOPY    . UTERINE FIBROID EMBOLIZATION     Social History   Socioeconomic History  . Marital status: Married    Spouse name: Not on file  . Number of children: 0  . Years of education: Not on file  . Highest education level: Bachelor's degree (e.g., BA, AB, BS)  Occupational History  . Not on file  Social Needs  . Financial resource strain: Not hard at all  . Food insecurity    Worry: Never true    Inability: Never true  . Transportation needs    Medical: No    Non-medical: No  Tobacco Use  . Smoking status: Never Smoker  . Smokeless tobacco: Never Used  Substance and Sexual Activity  . Alcohol use: Yes    Alcohol/week: 1.0 - 2.0 standard drinks    Types: 1 - 2 Cans of beer per week    Frequency: Never    Comment: 1-2 x per month  . Drug use: No  . Sexual activity: Yes    Birth control/protection: I.U.D.  Lifestyle  . Physical activity    Days per week: 5 days    Minutes per session: 40 min  . Stress: To some extent  Relationships  . Social Herbalist on phone: Not on file    Gets  together: Not on file    Attends religious service: Not on file    Active member of club or organization: Not on file    Attends meetings of clubs or organizations: Not on file    Relationship status: Not on file  . Intimate partner violence    Fear of current or ex partner: No    Emotionally abused: No    Physically abused: No    Forced sexual activity: No  Other Topics Concern  . Not on file  Social History Narrative  . Not on file   Family History  Problem Relation Age of Onset  . COPD Father   . Healthy Mother   . Heart attack Paternal Grandfather 88  . Breast cancer Neg Hx    Current Outpatient Medications on File Prior to Visit  Medication Sig  . ALPRAZolam (XANAX) 0.5 MG tablet Take HALF TAB PO BID PRN ANXIETY  . escitalopram (LEXAPRO) 10 MG tablet Take 0.5 tablets (5 mg total) by mouth daily.  Marland Kitchen KERYDIN 5 % SOLN Apply 1 application topically daily.  Marland Kitchen levonorgestrel (MIRENA) 20 MCG/24HR IUD 1 each by Intrauterine route once.  Marland Kitchen levothyroxine (SYNTHROID) 50 MCG tablet TAKE ONE TABLET BY MOUTH IN THE MORNING 30  MINUTES  BEFORE  BREAKFAST  .  hydrOXYzine (ATARAX/VISTARIL) 10 MG tablet Take 1-2 tablets (10-20 mg total) by mouth 3 (three) times daily as needed for anxiety. (Patient not taking: Reported on 03/04/2019)   No current facility-administered medications on file prior to visit.     Review of Systems Per HPI unless specifically indicated above     Objective:    BP 125/68 (BP Location: Left Arm, Patient Position: Sitting, Cuff Size: Normal)   Pulse 70   Ht 5\' 6"  (1.676 m)   Wt 140 lb 3.2 oz (63.6 kg)   BMI 22.63 kg/m   Wt Readings from Last 3 Encounters:  03/04/19 140 lb 3.2 oz (63.6 kg)  12/21/18 139 lb (63 kg)  11/12/18 139 lb (63 kg)    Physical Exam Vitals signs and nursing note reviewed.  Constitutional:      General: She is not in acute distress.    Appearance: She is well-developed.  HENT:     Head: Normocephalic and atraumatic.     Right Ear:  External ear normal.     Left Ear: External ear normal.     Nose: Nose normal.  Eyes:     Conjunctiva/sclera: Conjunctivae normal.     Pupils: Pupils are equal, round, and reactive to light.  Neck:     Musculoskeletal: Normal range of motion and neck supple.     Thyroid: No thyromegaly.     Vascular: No JVD.     Trachea: No tracheal deviation.  Cardiovascular:     Rate and Rhythm: Normal rate and regular rhythm.     Heart sounds: Normal heart sounds. No murmur. No friction rub. No gallop.   Pulmonary:     Effort: Pulmonary effort is normal. No respiratory distress.     Breath sounds: Normal breath sounds.  Chest:     Comments: Breast - Normal exam w/ symmetric breasts, no mass, no nipple discharge, no skin changes or tenderness.   Abdominal:     General: Bowel sounds are normal. There is no distension.     Palpations: Abdomen is soft.     Tenderness: There is no abdominal tenderness.  Musculoskeletal: Normal range of motion.  Lymphadenopathy:     Cervical: No cervical adenopathy.  Skin:    General: Skin is warm and dry.  Neurological:     Mental Status: She is alert and oriented to person, place, and time.     Cranial Nerves: No cranial nerve deficit.  Psychiatric:        Behavior: Behavior normal.        Thought Content: Thought content normal.        Judgment: Judgment normal.     Results for orders placed or performed in visit on 08/14/18  TSH  Result Value Ref Range   TSH 1.45 mIU/L      Assessment & Plan:   Problem List Items Addressed This Visit    None    Visit Diagnoses    Encounter for annual physical exam    -  Primary   Relevant Orders   HIV Antibody (routine testing w rflx)   Lipid panel   COMPLETE METABOLIC PANEL WITH GFR   CBC with Differential/Platelet   Hemoglobin A1c   Screening for HIV without presence of risk factors       Relevant Orders   HIV Antibody (routine testing w rflx)     Annual physical exam with no new findings.  Well adult  with no acute concerns.  Plan: 1. Obtain health maintenance screenings  as above according to age. - Increase physical activity to 30 minutes most days of the week.  - Eat healthy diet high in vegetables and fruits; low in refined carbohydrates. - Screening labs and tests as ordered 2. Return 1 year for annual physical.    Follow up plan: Return in about 1 year (around 03/03/2020) for annual physical AND 6 months for thyroid.  Cassell Smiles, DNP, AGPCNP-BC Adult Gerontology Primary Care Nurse Practitioner [facility] 03/04/2019, 2:38 PM

## 2019-03-08 ENCOUNTER — Encounter: Payer: Self-pay | Admitting: Nurse Practitioner

## 2019-03-09 ENCOUNTER — Other Ambulatory Visit: Payer: Self-pay

## 2019-03-09 ENCOUNTER — Other Ambulatory Visit: Payer: PRIVATE HEALTH INSURANCE

## 2019-03-10 LAB — CBC WITH DIFFERENTIAL/PLATELET
Absolute Monocytes: 593 cells/uL (ref 200–950)
Basophils Absolute: 83 cells/uL (ref 0–200)
Basophils Relative: 1.1 %
Eosinophils Absolute: 293 cells/uL (ref 15–500)
Eosinophils Relative: 3.9 %
HCT: 41 % (ref 35.0–45.0)
Hemoglobin: 13.6 g/dL (ref 11.7–15.5)
Lymphs Abs: 1545 cells/uL (ref 850–3900)
MCH: 31.8 pg (ref 27.0–33.0)
MCHC: 33.2 g/dL (ref 32.0–36.0)
MCV: 95.8 fL (ref 80.0–100.0)
MPV: 11.1 fL (ref 7.5–12.5)
Monocytes Relative: 7.9 %
Neutro Abs: 4988 cells/uL (ref 1500–7800)
Neutrophils Relative %: 66.5 %
Platelets: 236 10*3/uL (ref 140–400)
RBC: 4.28 10*6/uL (ref 3.80–5.10)
RDW: 12.8 % (ref 11.0–15.0)
Total Lymphocyte: 20.6 %
WBC: 7.5 10*3/uL (ref 3.8–10.8)

## 2019-03-10 LAB — COMPLETE METABOLIC PANEL WITH GFR
AG Ratio: 1.7 (calc) (ref 1.0–2.5)
ALT: 12 U/L (ref 6–29)
AST: 15 U/L (ref 10–30)
Albumin: 4.4 g/dL (ref 3.6–5.1)
Alkaline phosphatase (APISO): 52 U/L (ref 31–125)
BUN: 16 mg/dL (ref 7–25)
CO2: 29 mmol/L (ref 20–32)
Calcium: 9.7 mg/dL (ref 8.6–10.2)
Chloride: 103 mmol/L (ref 98–110)
Creat: 1 mg/dL (ref 0.50–1.10)
GFR, Est African American: 79 mL/min/{1.73_m2} (ref >=60)
GFR, Est Non African American: 68 mL/min/{1.73_m2} (ref >=60)
Globulin: 2.6 g/dL (calc) (ref 1.9–3.7)
Glucose, Bld: 92 mg/dL (ref 65–99)
Potassium: 4.5 mmol/L (ref 3.5–5.3)
Sodium: 139 mmol/L (ref 135–146)
Total Bilirubin: 0.8 mg/dL (ref 0.2–1.2)
Total Protein: 7 g/dL (ref 6.1–8.1)

## 2019-03-10 LAB — HEMOGLOBIN A1C
Hgb A1c MFr Bld: 5.1 % of total Hgb (ref <5.7)
Mean Plasma Glucose: 100 (calc)
eAG (mmol/L): 5.5 (calc)

## 2019-03-10 LAB — LIPID PANEL
Cholesterol: 159 mg/dL (ref <200)
HDL: 63 mg/dL (ref >=50)
LDL Cholesterol (Calc): 80 mg/dL (calc)
Non-HDL Cholesterol (Calc): 96 mg/dL (calc) (ref <130)
Total CHOL/HDL Ratio: 2.5 (calc) (ref <5.0)
Triglycerides: 77 mg/dL (ref <150)

## 2019-03-10 LAB — HIV ANTIBODY (ROUTINE TESTING W REFLEX): HIV 1&2 Ab, 4th Generation: NONREACTIVE

## 2019-07-13 ENCOUNTER — Ambulatory Visit: Payer: PRIVATE HEALTH INSURANCE | Admitting: Obstetrics & Gynecology

## 2019-07-16 ENCOUNTER — Other Ambulatory Visit: Payer: Self-pay

## 2019-07-16 DIAGNOSIS — E039 Hypothyroidism, unspecified: Secondary | ICD-10-CM

## 2019-07-16 MED ORDER — LEVOTHYROXINE SODIUM 50 MCG PO TABS: ORAL_TABLET | ORAL | 1 refills | Status: DC

## 2019-07-19 ENCOUNTER — Telehealth: Payer: Self-pay

## 2019-07-19 NOTE — Telephone Encounter (Signed)
Pt called requesting that we check on the status of her refill on her Levothyroxine. I called in the already electronic sent prescription for Levothyroxine that was sent on Friday, January 8th.

## 2019-08-04 ENCOUNTER — Encounter: Payer: Self-pay | Admitting: Obstetrics & Gynecology

## 2019-08-04 ENCOUNTER — Other Ambulatory Visit: Payer: Self-pay

## 2019-08-04 ENCOUNTER — Ambulatory Visit (INDEPENDENT_AMBULATORY_CARE_PROVIDER_SITE_OTHER): Payer: 59 | Admitting: Obstetrics & Gynecology

## 2019-08-04 VITALS — BP 120/80 | Ht 66.0 in | Wt 140.0 lb

## 2019-08-04 DIAGNOSIS — Z1231 Encounter for screening mammogram for malignant neoplasm of breast: Secondary | ICD-10-CM

## 2019-08-04 DIAGNOSIS — Z01419 Encounter for gynecological examination (general) (routine) without abnormal findings: Secondary | ICD-10-CM | POA: Diagnosis not present

## 2019-08-04 DIAGNOSIS — Z124 Encounter for screening for malignant neoplasm of cervix: Secondary | ICD-10-CM

## 2019-08-04 NOTE — Progress Notes (Signed)
HPI:      [name] is a 44 y.o. G2P0020 who LMP was No LMP recorded. (Menstrual status: IUD)., she presents today for her annual examination. The patient has no complaints today. The patient is sexually active. Her last pap: approximate date 2018 and was normal and last mammogram: approximate date 2020 and was normal. The patient does perform self breast exams.  There is no notable family history of breast or ovarian cancer in her family.  The patient has regular exercise: yes.  The patient denies current symptoms of depression.    GYN History: Contraception: IUD  PMHx: Past Medical History:  Diagnosis Date  . Anxiety   . Fibroids   . Hx of dysplastic nevus 11/18/2018   L low back paraspinal above sacral area  . Spontaneous abortion   . Thyroid disease    Past Surgical History:  Procedure Laterality Date  . LAPAROSCOPY    . UTERINE FIBROID EMBOLIZATION     Family History  Problem Relation Age of Onset  . COPD Father   . Healthy Mother   . Heart attack Paternal Grandfather 64  . Breast cancer Neg Hx    Social History   Tobacco Use  . Smoking status: Never Smoker  . Smokeless tobacco: Never Used  Substance Use Topics  . Alcohol use: Yes    Alcohol/week: 1.0 - 2.0 standard drinks    Types: 1 - 2 Cans of beer per week    Comment: 1-2 x per month  . Drug use: No    Current Outpatient Medications:  .  escitalopram (LEXAPRO) 10 MG tablet, Take 0.5 tablets (5 mg total) by mouth daily., Disp: 45 tablet, Rfl: 1 .  levonorgestrel (MIRENA) 20 MCG/24HR IUD, 1 each by Intrauterine route once., Disp: , Rfl:  .  levothyroxine (SYNTHROID) 50 MCG tablet, TAKE ONE TABLET BY MOUTH IN THE MORNING 30  MINUTES  BEFORE  BREAKFAST, Disp: 90 tablet, Rfl: 1 .  hydrOXYzine (ATARAX/VISTARIL) 10 MG tablet, Take 1-2 tablets (10-20 mg total) by mouth 3 (three) times daily as needed for anxiety. (Patient not taking: Reported on 03/04/2019), Disp: 30 tablet, Rfl: 0 .  KERYDIN 5 % SOLN,  Apply 1 application topically daily., Disp: , Rfl:  Allergies: Promethazine  Review of Systems  Constitutional: Negative for chills, fever and malaise/fatigue.  HENT: Negative for congestion, sinus pain and sore throat.   Eyes: Negative for blurred vision and pain.  Respiratory: Negative for cough and wheezing.   Cardiovascular: Negative for chest pain and leg swelling.  Gastrointestinal: Negative for abdominal pain, constipation, diarrhea, heartburn, nausea and vomiting.  Genitourinary: Negative for dysuria, frequency, hematuria and urgency.  Musculoskeletal: Negative for back pain, joint pain, myalgias and neck pain.  Skin: Negative for itching and rash.  Neurological: Negative for dizziness, tremors and weakness.  Endo/Heme/Allergies: Does not bruise/bleed easily.  Psychiatric/Behavioral: Negative for depression. The patient is not nervous/anxious and does not have insomnia.     Objective: BP 120/80   Ht 5\' 6"  (1.676 m)   Wt 140 lb (63.5 kg)   BMI 22.60 kg/m   Filed Weights   08/04/19 1421  Weight: 140 lb (63.5 kg)   Body mass index is 22.6 kg/m. Physical Exam Constitutional:      General: She is not in acute distress.    Appearance: She is well-developed.  Genitourinary:     Pelvic exam was performed with patient supine.     Vagina, uterus and rectum normal.  No lesions in the vagina.     No vaginal bleeding.     No cervical motion tenderness, friability, lesion or polyp.     IUD strings visualized.     Uterus is mobile.     Uterus is not enlarged.     No uterine mass detected.    Uterus is midaxial.     No right or left adnexal mass present.     Right adnexa not tender.     Left adnexa not tender.     Genitourinary Comments: Min enlarged uterus, no fibroids themselves palpated  HENT:     Head: Normocephalic and atraumatic. No laceration.     Right Ear: Hearing normal.     Left Ear: Hearing normal.     Mouth/Throat:     Pharynx: Uvula midline.  Eyes:      Pupils: Pupils are equal, round, and reactive to light.  Neck:     Thyroid: No thyromegaly.  Cardiovascular:     Rate and Rhythm: Normal rate and regular rhythm.     Heart sounds: No murmur. No friction rub. No gallop.   Pulmonary:     Effort: Pulmonary effort is normal. No respiratory distress.     Breath sounds: Normal breath sounds. No wheezing.  Chest:     Breasts:        Right: No mass, skin change or tenderness.        Left: No mass, skin change or tenderness.  Abdominal:     General: Bowel sounds are normal. There is no distension.     Palpations: Abdomen is soft.     Tenderness: There is no abdominal tenderness. There is no rebound.  Musculoskeletal:        General: Normal range of motion.     Cervical back: Normal range of motion and neck supple.  Neurological:     Mental Status: She is alert and oriented to person, place, and time.     Cranial Nerves: No cranial nerve deficit.  Skin:    General: Skin is warm and dry.  Psychiatric:        Judgment: Judgment normal.  Vitals reviewed.     Assessment:  ANNUAL EXAM 1. Women's annual routine gynecological examination   2. Encounter for screening mammogram for malignant neoplasm of breast   3. Screening for cervical cancer      Screening Plan:            1.  Cervical Screening-  Pap smear schedule reviewed with patient Due next year  2. Breast screening- Exam annually and mammogram>40 planned   3. Labs managed by PCP  4. Counseling for contraception: IUD     F/U  Return in about 1 year (around 08/03/2020) for Annual.  Barnett Applebaum, MD, [facility], [facility] 08/04/2019  2:46 PM

## 2019-08-04 NOTE — Patient Instructions (Signed)
PAP every three years Mammogram every year    Call [phone] to schedule at [facility] yearly (with PCP)   

## 2019-08-09 ENCOUNTER — Telehealth: Payer: Self-pay

## 2019-08-09 NOTE — Telephone Encounter (Signed)
Confirmed appointment with patient and screened for covid. klh 

## 2019-08-09 NOTE — Telephone Encounter (Signed)
Done by mistake

## 2019-08-10 ENCOUNTER — Ambulatory Visit: Payer: 59 | Admitting: Internal Medicine

## 2019-08-10 ENCOUNTER — Other Ambulatory Visit: Payer: Self-pay

## 2019-08-10 ENCOUNTER — Encounter: Payer: Self-pay | Admitting: Internal Medicine

## 2019-08-10 DIAGNOSIS — E039 Hypothyroidism, unspecified: Secondary | ICD-10-CM | POA: Diagnosis not present

## 2019-08-10 DIAGNOSIS — Z0001 Encounter for general adult medical examination with abnormal findings: Secondary | ICD-10-CM | POA: Diagnosis not present

## 2019-08-10 DIAGNOSIS — F411 Generalized anxiety disorder: Secondary | ICD-10-CM

## 2019-08-10 MED ORDER — ESCITALOPRAM OXALATE 10 MG PO TABS: 5.0000 mg | ORAL_TABLET | Freq: Every day | ORAL | 12 refills | Status: DC

## 2019-08-10 MED ORDER — ALPRAZOLAM 0.25 MG PO TABS: 0.2500 mg | ORAL_TABLET | Freq: Two times a day (BID) | ORAL | 0 refills | Status: DC | PRN

## 2019-08-10 NOTE — Progress Notes (Signed)
[facility] [place], [place] [address]  Internal MEDICINE  Office Visit Note  Patient Name: Tiffany Frey  [id number]  [id number]  Date of Service: 08/18/2019  Chief Complaint  Patient presents with  . Annual Exam  . Hypothyroidism  . Arthritis  . Anxiety  . Finger Injury    splinter in right thumb nail      HPI Pt is here for routine health maintenance examination. She feels well. She does have ObGyn for IUD and pap smears. She is c/o of a splinter under her right thumb since November, she said intially it was swollen but now just the pigmentation under thumb nail, no pain or fever, continues to take her synthroid, she has to see another physician due to change in her insurance coverage. Anxiety is under good control   Current Medication: Outpatient Encounter Medications as of 08/10/2019  Medication Sig  . escitalopram (LEXAPRO) 10 MG tablet Take 0.5 tablets (5 mg total) by mouth daily.  Marland Kitchen levonorgestrel (MIRENA) 20 MCG/24HR IUD 1 each by Intrauterine route once.  Marland Kitchen levothyroxine (SYNTHROID) 50 MCG tablet TAKE ONE TABLET BY MOUTH IN THE MORNING 30  MINUTES  BEFORE  BREAKFAST  . [DISCONTINUED] escitalopram (LEXAPRO) 10 MG tablet Take 0.5 tablets (5 mg total) by mouth daily.  Marland Kitchen ALPRAZolam (XANAX) 0.25 MG tablet Take 1 tablet (0.25 mg total) by mouth 2 (two) times daily as needed for anxiety.  . [DISCONTINUED] hydrOXYzine (ATARAX/VISTARIL) 10 MG tablet Take 1-2 tablets (10-20 mg total) by mouth 3 (three) times daily as needed for anxiety. (Patient not taking: Reported on 08/10/2019)  . [DISCONTINUED] KERYDIN 5 % SOLN Apply 1 application topically daily.   No facility-administered encounter medications on file as of 08/10/2019.    Surgical History: Past Surgical History:  Procedure Laterality Date  . LAPAROSCOPY    . UTERINE FIBROID EMBOLIZATION      Medical History: Past Medical History:  Diagnosis Date  . Anxiety   . Fibroids   . Hx of  dysplastic nevus 11/18/2018   L low back paraspinal above sacral area  . Spontaneous abortion   . Thyroid disease     Family History: Family History  Problem Relation Age of Onset  . COPD Father   . Healthy Mother   . Heart attack Paternal Grandfather 83  . Breast cancer Neg Hx       Review of Systems  Constitutional: Negative for chills, diaphoresis and fatigue.  HENT: Negative for ear pain, postnasal drip and sinus pressure.   Eyes: Negative for photophobia, discharge, redness, itching and visual disturbance.  Respiratory: Negative for cough, shortness of breath and wheezing.   Cardiovascular: Negative for chest pain, palpitations and leg swelling.  Gastrointestinal: Negative for abdominal pain, constipation, diarrhea, nausea and vomiting.  Genitourinary: Negative for dysuria and flank pain.  Musculoskeletal: Negative for arthralgias, back pain, gait problem and neck pain.  Skin: Negative for color change.  Allergic/Immunologic: Negative for environmental allergies and food allergies.  Neurological: Negative for dizziness and headaches.  Hematological: Does not bruise/bleed easily.  Psychiatric/Behavioral: Negative for agitation, behavioral problems (depression) and hallucinations.     Vital Signs: BP 140/83   Pulse 69   Temp (!) 97.4 F (36.3 C)   Resp 16   Ht 5\' 6"  (1.676 m)   Wt 144 lb 3.2 oz (65.4 kg)   SpO2 100%   BMI 23.27 kg/m    Physical Exam Constitutional:      General: She is not  in acute distress.    Appearance: She is well-developed. She is not diaphoretic.  HENT:     Head: Normocephalic and atraumatic.     Mouth/Throat:     Pharynx: No oropharyngeal exudate.  Eyes:     Extraocular Movements: Extraocular movements intact.     Pupils: Pupils are equal, round, and reactive to light.  Neck:     Thyroid: No thyromegaly.     Vascular: No JVD.     Trachea: No tracheal deviation.  Cardiovascular:     Rate and Rhythm: Normal rate and regular  rhythm.     Heart sounds: Normal heart sounds. No murmur. No friction rub. No gallop.   Pulmonary:     Effort: Pulmonary effort is normal. No respiratory distress.     Breath sounds: No wheezing or rales.  Chest:     Chest wall: No tenderness.  Abdominal:     General: Bowel sounds are normal.     Palpations: Abdomen is soft.  Musculoskeletal:        General: Normal range of motion.     Cervical back: Normal range of motion and neck supple.  Lymphadenopathy:     Cervical: No cervical adenopathy.  Skin:    General: Skin is warm and dry.     Coloration: Skin is not pale.  Neurological:     Mental Status: She is alert and oriented to person, place, and time.     Cranial Nerves: No cranial nerve deficit.  Psychiatric:        Behavior: Behavior normal.        Thought Content: Thought content normal.        Judgment: Judgment normal.    Assessment/Plan: 1. Encounter for general adult medical examination with abnormal findings - Pap and IUD per ObGyn, mammogram as well  - UA/M w/rflx Culture, Routine - Microscopic Examination  2. GAD (generalized anxiety disorder) - Controlled with meds  - escitalopram (LEXAPRO) 10 MG tablet; Take 0.5 tablets (5 mg total) by mouth daily.  Dispense: 45 tablet; Refill: 12 - ALPRAZolam (XANAX) 0.25 MG tablet; Take 1 tablet (0.25 mg total) by mouth 2 (two) times daily as needed for anxiety.  Dispense: 20 tablet; Refill: 0  3. Hypothyroidism, unspecified type Continue Synthroid as before   General Counseling: Jessice verbalizes understanding of the findings of todays visit and agrees with plan of treatment. I have discussed any further diagnostic evaluation that may be needed or ordered today. We also reviewed her medications today. she has been encouraged to call the office with any questions or concerns that should arise related to todays visit.   Orders Placed This Encounter  Procedures  . Microscopic Examination  . UA/M w/rflx Culture, Routine     Meds ordered this encounter  Medications  . escitalopram (LEXAPRO) 10 MG tablet    Sig: Take 0.5 tablets (5 mg total) by mouth daily.    Dispense:  45 tablet    Refill:  12  . ALPRAZolam (XANAX) 0.25 MG tablet    Sig: Take 1 tablet (0.25 mg total) by mouth 2 (two) times daily as needed for anxiety.    Dispense:  20 tablet    Refill:  0    Total time spent:35 Minutes  Time spent includes review of chart, medications, test results, and follow up plan with the patient.     Lavera Guise, MD  Internal Medicine

## 2019-08-11 LAB — MICROSCOPIC EXAMINATION
Bacteria, UA: NONE SEEN
Casts: NONE SEEN /lpf

## 2019-08-11 LAB — UA/M W/RFLX CULTURE, ROUTINE
Bilirubin, UA: NEGATIVE
Glucose, UA: NEGATIVE
Ketones, UA: NEGATIVE
Leukocytes,UA: NEGATIVE
Nitrite, UA: NEGATIVE
Protein,UA: NEGATIVE
RBC, UA: NEGATIVE
Specific Gravity, UA: 1.028 (ref 1.005–1.030)
Urobilinogen, Ur: 0.2 mg/dL (ref 0.2–1.0)
pH, UA: 5 (ref 5.0–7.5)

## 2019-09-02 ENCOUNTER — Ambulatory Visit: Payer: PRIVATE HEALTH INSURANCE | Admitting: Nurse Practitioner

## 2019-09-03 ENCOUNTER — Ambulatory Visit: Payer: PRIVATE HEALTH INSURANCE | Admitting: Nurse Practitioner

## 2019-09-06 ENCOUNTER — Ambulatory Visit
Admission: RE | Admit: 2019-09-06 | Discharge: 2019-09-06 | Disposition: A | Discharge status: Home / Self Care | Payer: 59 | Source: Ambulatory Visit | Attending: Obstetrics & Gynecology | Admitting: Obstetrics & Gynecology

## 2019-09-06 DIAGNOSIS — Z1231 Encounter for screening mammogram for malignant neoplasm of breast: Secondary | ICD-10-CM | POA: Diagnosis not present

## 2019-09-28 ENCOUNTER — Ambulatory Visit (INDEPENDENT_AMBULATORY_CARE_PROVIDER_SITE_OTHER): Payer: 59 | Admitting: Dermatology

## 2019-09-28 ENCOUNTER — Other Ambulatory Visit: Payer: Self-pay

## 2019-09-28 DIAGNOSIS — B965 Pseudomonas (aeruginosa) (mallei) (pseudomallei) as the cause of diseases classified elsewhere: Secondary | ICD-10-CM

## 2019-09-28 DIAGNOSIS — L609 Nail disorder, unspecified: Secondary | ICD-10-CM

## 2019-09-28 DIAGNOSIS — B351 Tinea unguium: Secondary | ICD-10-CM

## 2019-09-28 MED ORDER — GENTAMICIN SULFATE 0.1 % EX OINT: 1.0000 "application " | TOPICAL_OINTMENT | Freq: Two times a day (BID) | CUTANEOUS | 1 refills | Status: DC

## 2019-09-28 MED ORDER — CIPROFLOXACIN HCL 500 MG PO TABS: 500.0000 mg | ORAL_TABLET | Freq: Two times a day (BID) | ORAL | 0 refills | Status: AC

## 2019-09-28 MED ORDER — FLUCONAZOLE 200 MG PO TABS: 200.0000 mg | ORAL_TABLET | Freq: Every day | ORAL | 0 refills | Status: DC

## 2019-09-28 MED ORDER — TAVABOROLE 5 % EX SOLN: CUTANEOUS | 11 refills | Status: DC

## 2019-09-28 NOTE — Progress Notes (Signed)
   Follow-Up Visit   Subjective  Tiffany Frey is a 44 y.o. female who presents for the following: spot (Pt had splinter in thumb > 3 months ago. Would flare off and on and has now moved to thumbnail. ) and Nail Problem (Pt had + fungal culture in the past from toenails. She uses [place] with good results and would like a refill.).  Affected right thumb is painful.  The following portions of the chart were reviewed this encounter and updated as appropriate:     Review of Systems: No other skin or systemic complaints.  Objective  Well appearing patient in no apparent distress; mood and affect are within normal limits.  A focused examination was performed including fingernails, toenails, hands, feet. Relevant physical exam findings are noted in the Assessment and Plan.  Objective  R thumb nail base, periungual: Dark yellow green discoloration c/w pseudomonas nail infection and probable concomitant yeast/fungal infection, no evidence of foreign body in surrounding skin or nail  Images    Objective  R 3rd toenail, L great toenail, L 3rd toenail: Yellow white discoloration with thickening.   Images      Assessment & Plan  Nail problem R thumb nail base, periungual  Start Cipro 500mg  1 PO BID x 14 days #28 0RF Start gentamicin ointment to AA BID  Start Kerydin QHS to AA fingernail.  Minimize water exposure  ciprofloxacin (CIPRO) 500 MG tablet - R thumb nail base, periungual  gentamicin ointment (GARAMYCIN) 0.1 % - R thumb nail base, periungual  Onychomycosis  Pseudomonas (aeruginosa) (mallei) (pseudomallei) as the cause of diseases classified elsewhere  Tinea unguium R 3rd toenail, L great toenail, L 3rd toenail  Start fluconazole 200mg  1 PO QWK #30 0RF (Written as daily but pt instructed to take 1 PO once a week) Risks reviewed- no h/o liver problems.  Restart Kerydin to nails QHS   Tavaborole (KERYDIN) 5 % SOLN - R 3rd toenail, L great toenail, L 3rd  toenail  fluconazole (DIFLUCAN) 200 MG tablet - R 3rd toenail, L great toenail, L 3rd toenail  Return in about 1 month (around 10/29/2019) for Nail follow up.   [name], RMA, am acting as scribe for [name], MD .

## 2019-09-29 ENCOUNTER — Ambulatory Visit: Payer: Self-pay | Admitting: Dermatology

## 2019-10-26 ENCOUNTER — Other Ambulatory Visit: Payer: Self-pay

## 2019-10-26 ENCOUNTER — Ambulatory Visit (INDEPENDENT_AMBULATORY_CARE_PROVIDER_SITE_OTHER): Payer: 59 | Admitting: Dermatology

## 2019-10-26 DIAGNOSIS — L609 Nail disorder, unspecified: Secondary | ICD-10-CM | POA: Diagnosis not present

## 2019-10-26 DIAGNOSIS — B351 Tinea unguium: Secondary | ICD-10-CM | POA: Diagnosis not present

## 2019-10-26 MED ORDER — GENTAMICIN FORTIFIED OPHTHALMIC SOLUTION: OPHTHALMIC | 1 refills | Status: DC

## 2019-10-26 MED ORDER — GENTAMICIN SULFATE 0.3 % OP SOLN: OPHTHALMIC | 2 refills | Status: DC

## 2019-10-26 NOTE — Progress Notes (Signed)
   Follow-Up Visit   Subjective  Tiffany Frey is a 44 y.o. female who presents for the following: Follow-up.  Patient here for 1 month follow up for onychomycosis of the R 3rd toenail L great toenail and L 3rd toenail. She is taking fluconazole 200mg  1 PO QWK and using Kerydin topically. She is also being followed up for a nail problem of the R thumb base. She took Cipro 500mg  BID x 14 days and the redness and swelling has improved. She is still using gentamicin ointment topically which is too greasy and Kerydin solution.     The following portions of the chart were reviewed this encounter and updated as appropriate:     Review of Systems:  No other skin or systemic complaints except as noted in HPI or Assessment and Plan.  Objective  Well appearing patient in no apparent distress; mood and affect are within normal limits.  A focused examination was performed including toenails, fingernails. Relevant physical exam findings are noted in the Assessment and Plan.  Objective  Right Thumb Nail: Dark greenish-brown discoloration at periungual thumb nail, with clear nail noted at base  Objective  Right great toenail, R 3rd toenail, L 3rd toenail: Nail dystrophy with white discoloration and thickening   Assessment & Plan  Nail problem Right Thumb Nail  Probable Pseudomonas/Candida- improving Start gentamicin solution to AA thumb nail QD-BID (ointment was too greasy) Continue Kerydin solution qD Continue Fluconazole 200 mg PO qwk  Ordered Medications: gentamicin (GARAMYCIN) 0.3 % ophthalmic solution  Reordered Medications Gentamicin Sulfate (GENTAMICIN FORTIFIED) 7 ml SOLN  Onychomycosis Right great toenail, R 3rd toenail, L 3rd toenail  Cont Kerydin QHS Cont fluconazole 200mg  1 Po QWK  Return in about 3 months (around 01/25/2020) for Nail problem, onychomycosis.  [name], RMA, am acting as scribe for [name], MD .   Documentation: I have reviewed the  above documentation for accuracy and completeness, and I agree with the above.  [name], MD

## 2019-10-28 ENCOUNTER — Other Ambulatory Visit: Payer: Self-pay

## 2019-10-28 NOTE — Progress Notes (Signed)
Error

## 2019-12-15 ENCOUNTER — Telehealth: Payer: Self-pay

## 2019-12-15 MED ORDER — JUBLIA 10 % EX SOLN: 1.0000 "application " | Freq: Every evening | CUTANEOUS | 5 refills | Status: DC

## 2019-12-15 NOTE — Telephone Encounter (Signed)
Patient called because [place] is no longer covered and she can not get for $75 due to going to generic. Okay to send in [place] to try?

## 2019-12-15 NOTE — Telephone Encounter (Signed)
Jublia sent into [facility]. I have left a message for the patient to return my call. Sample of Jublia and info on pharmacy left at front desk for pick up.

## 2019-12-15 NOTE — Telephone Encounter (Signed)
Yes, send in [place], 10 ml, 5 rfs

## 2019-12-15 NOTE — Telephone Encounter (Signed)
Spoke with patient and she has purchased [place] from [place] for $60.

## 2019-12-31 ENCOUNTER — Telehealth: Payer: Self-pay

## 2019-12-31 NOTE — Telephone Encounter (Signed)
Sch her for GYN [place] and appt w [place] next week please.

## 2019-12-31 NOTE — Telephone Encounter (Signed)
Pt aware PH is not in office until 6/28.  Adv pt to start with [place] and we'll go from there.  Pt can be reached at [phone].

## 2019-12-31 NOTE — Telephone Encounter (Signed)
Pt calling; for the past couple weeks has had right side pressure; when she bends or sits a certain way it's uncomfortable with some pain; also has pressure in lower right side of her back.  She does have a fibroid.  Should she see Ph or primary?  [phone]

## 2020-01-03 ENCOUNTER — Other Ambulatory Visit: Payer: Self-pay | Admitting: Obstetrics & Gynecology

## 2020-01-03 DIAGNOSIS — R102 Pelvic and perineal pain: Secondary | ICD-10-CM

## 2020-01-03 NOTE — Telephone Encounter (Signed)
Patient is scheduled for 01/14/20 for ultrasound at 11:30  and follow up with [facility] at 1:50. Currently no opening in ultrasound this week. Please please ultrasound order and can the follow up be via phone?

## 2020-01-14 ENCOUNTER — Ambulatory Visit (INDEPENDENT_AMBULATORY_CARE_PROVIDER_SITE_OTHER): Payer: 59 | Admitting: Obstetrics & Gynecology

## 2020-01-14 ENCOUNTER — Encounter: Payer: Self-pay | Admitting: Obstetrics & Gynecology

## 2020-01-14 ENCOUNTER — Other Ambulatory Visit: Payer: Self-pay

## 2020-01-14 ENCOUNTER — Ambulatory Visit (INDEPENDENT_AMBULATORY_CARE_PROVIDER_SITE_OTHER): Payer: 59

## 2020-01-14 VITALS — BP 148/80 | Ht 66.0 in | Wt 138.0 lb

## 2020-01-14 DIAGNOSIS — D219 Benign neoplasm of connective and other soft tissue, unspecified: Secondary | ICD-10-CM | POA: Diagnosis not present

## 2020-01-14 DIAGNOSIS — T8332XA Displacement of intrauterine contraceptive device, initial encounter: Secondary | ICD-10-CM

## 2020-01-14 DIAGNOSIS — R1031 Right lower quadrant pain: Secondary | ICD-10-CM

## 2020-01-14 DIAGNOSIS — R102 Pelvic and perineal pain: Secondary | ICD-10-CM

## 2020-01-14 MED ORDER — NORETHINDRONE 0.35 MG PO TABS: 1.0000 | ORAL_TABLET | Freq: Every day | ORAL | 3 refills | Status: DC

## 2020-01-14 NOTE — Progress Notes (Signed)
HPI: Abdominal Pain Patient presents for evaluation of abdominal pain. The pain is described as aching and pressure-like, and is 6/10 in intensity. Pain is located in the RLQ and / or right back (intermittent in occuring) area without radiation. Onset was intermittent occurring 2 months ago. Symptoms have been stable since. Aggravating factors: none. Alleviating factors: none. Associated symptoms: none. The patient denies nausea and vomiting. Risk factors for pelvic/abdominal pain include uterine fibroids for years, no recent scans or interventions.   Prior [place].  Ultrasound demonstrates several fibroids, also malpositioned IUD, see below  PMHx: She  has a past medical history of Anxiety, Fibroids, dysplastic nevus (11/18/2018), Spontaneous abortion, and Thyroid disease. Also,  has a past surgical history that includes laparoscopy and Uterine fibroid embolization., family history includes COPD in her father; Healthy in her mother; Heart attack (age of onset: 40) in her paternal grandfather.,  reports that she has never smoked. She has never used smokeless tobacco. She reports current alcohol use of about 1.0 - 2.0 standard drink of alcohol per week. She reports that she does not use drugs.  She has a current medication list which includes the following prescription(s): alprazolam, jublia, escitalopram, fluconazole, gentamicin, gentamicin fortified, levonorgestrel, levothyroxine, and tavaborole. Also, is allergic to promethazine.  Review of Systems  Constitutional: Negative for chills, fever and malaise/fatigue.  HENT: Negative for congestion, sinus pain and sore throat.   Eyes: Negative for blurred vision and pain.  Respiratory: Negative for cough and wheezing.   Cardiovascular: Negative for chest pain and leg swelling.  Gastrointestinal: Negative for abdominal pain, constipation, diarrhea, heartburn, nausea and vomiting.  Genitourinary: Negative for dysuria, frequency, hematuria and urgency.    Musculoskeletal: Negative for back pain, joint pain, myalgias and neck pain.  Skin: Negative for itching and rash.  Neurological: Negative for dizziness, tremors and weakness.  Endo/Heme/Allergies: Does not bruise/bleed easily.  Psychiatric/Behavioral: Negative for depression. The patient is not nervous/anxious and does not have insomnia.     Objective: BP (!) 148/80   Ht 5\' 6"  (1.676 m)   Wt 138 lb (62.6 kg)   BMI 22.27 kg/m   Physical examination Constitutional NAD, Conversant  Skin No rashes, lesions or ulceration.   Extremities: Moves all appropriately.  Normal ROM for age. No lymphadenopathy.  Neuro: Grossly intact  Psych: Oriented to PPT.  Normal mood. Normal affect.   US PELVIC COMPLETE WITH TRANSVAGINAL  Result Date: 01/14/2020 Patient Name: Tiffany Frey DOB: [birth year] MRN: [id number] ULTRASOUND REPORT Location: [facility] Date of Service: 01/14/2020 Indications:Pelvic Pain Findings: The uterus is anteverted and measures 9.1 x 4.8 x 4.2 cm. Echo texture is heterogenous with evidence of focal masses. Within the uterus are multiple suspected fibroids measuring: Fibroid 1:28.4 x 20.4 x 26.4 mm pedunculated right Fibroid 2:14.5 x 9.4 x 16.3 mm subserosal posterior Fibroid 3: 29.3 x 28.9 x 30.5 mm pedunculated anterior to inner cervical os, partially calcified Fibroid 4: 33.0 x 23.1 x 33.8 mm pedunculated posterior to the inner cervical os Fibroid 5: 43.9 x 28.5 x 37.9 mm pedunculated left adnexa, partially degenerating. Fluid portion measures 22.3 x 14.3 x 19.0 mm There is a calcification in this fibroid as well. The Endometrium measures 1.7 mm. The IUD is located in the lower uterine segment/cervical canal. Right Ovary measures 2.9 x 1.8 x 1.2 cm. It is normal in appearance. Left Ovary measures 2.6 x 2.5 x 1.5 cm. It is normal in appearance. Survey of the adnexa demonstrates no adnexal masses. There is no free fluid  in the cul de sac. Impression: 1. There appear to be 5 fibroids,  4 of which are pedunculated. 2. There is one fibroid that is partially degenerating to the left of the uterus. 3. The IUD is positioned low in the lower uterine segment/cervical canal. 4. Normal appearing ovaries. Recommendations: 1.Clinical correlation with the patient's History and Physical Exam. Gweneth Dimitri, RT Review of ULTRASOUND.    I have personally reviewed images and report of recent ultrasound done at [facility].    Plan of management to be discussed with patient. Barnett Applebaum, MD, [facility], [facility] 01/14/2020  2:10 PM   Assessment:  RLQ abdominal pain  Malpositioned intrauterine device (IUD), initial encounter  Fibroid   Fibroid treatment such as [place], Lupron, Myomectomy, and Hysterectomy discussed in detail, with the pros and cons of each choice counseled.  No treatment as an option also discussed, as well as control of symptoms alone with hormone therapy. Information provided to the patient.  If pains resolve after IUD removal, then continue expectant management and no surgery for fibroids needed.  Take POP for contraception for now.  If pains continue, consider hysterectomy for fibroids.  If no surgery, recheck [place] 6-9 mos for assessment of degree of fibroid growth.  A total of 30 minutes were spent face-to-face with the patient as well as preparation, review, communication, and documentation during this encounter.    IUD Removal Strings of IUD identified and grasped.  IUD removed without problem.  Pt tolerated this well.  IUD noted to be intact.  Assessment: IUD Removal  Plan: IUD removed and plan for contraception is oral progesterone-only contraceptive. She was amenable to this plan.  Barnett Applebaum, M.D. 01/14/2020 2:12 PM

## 2020-01-14 NOTE — Patient Instructions (Signed)
Thank you for choosing [facility]. As part of our ongoing efforts to improve patient experience, we would appreciate your feedback. Please fill out the short survey that you will receive by mail or MyChart. Your opinion is important to [place]! -Dr Kenton Kingfisher  Uterine Fibroids  Uterine fibroids (leiomyomas) are noncancerous (benign) tumors that can develop in the uterus. Fibroids may also develop in the fallopian tubes, cervix, or tissues (ligaments) near the uterus. You may have one or many fibroids. Fibroids vary in size, weight, and where they grow in the uterus. Some can become quite large. Most fibroids do not require medical treatment. What are the causes? The cause of this condition is not known. What increases the risk? You are more likely to develop this condition if you:  Are in your 30s or 40s and have not gone through menopause.  Have a family history of this condition.  Are of African-American descent.  Had your first period at an early age (early menarche).  Have not had any children (nulliparity).  Are overweight or obese. What are the signs or symptoms? Many women do not have any symptoms. Symptoms of this condition may include:  Heavy menstrual bleeding.  Bleeding or spotting between periods.  Pain and pressure in the pelvic area, between the hips.  Bladder problems, such as needing to urinate urgently or more often than usual.  Inability to have children (infertility).  Failure to carry pregnancy to term (miscarriage). How is this diagnosed? This condition may be diagnosed based on:  Your symptoms and medical history.  A physical exam.  A pelvic exam that includes feeling for any tumors.  Imaging tests, such as ultrasound or MRI. How is this treated? Treatment for this condition may include:  Seeing your health care provider for follow-up visits to monitor your fibroids for any changes.  Taking NSAIDs such as ibuprofen, naproxen, or aspirin to reduce  pain.  Hormone medicines. These may be taken as a pill, given in an injection, or delivered by a T-shaped device that is inserted into the uterus (intrauterine device, IUD).  Surgery to remove one of the following: ? The fibroids (myomectomy). Your health care provider may recommend this if fibroids affect your fertility and you want to become pregnant. ? The uterus (hysterectomy). ? Blood supply to the fibroids (uterine artery embolization). Follow these instructions at home:  Take over-the-counter and prescription medicines only as told by your health care provider.  Ask your health care provider if you should take iron pills or eat more iron-rich foods, such as dark green, leafy vegetables. Heavy menstrual bleeding can cause low iron levels.  If directed, apply heat to your back or abdomen to reduce pain. Use the heat source that your health care provider recommends, such as a moist heat pack or a heating pad. ? Place a towel between your skin and the heat source. ? Leave the heat on for 20-30 minutes. ? Remove the heat if your skin turns bright red. This is especially important if you are unable to feel pain, heat, or cold. You may have a greater risk of getting burned.  Pay close attention to your menstrual cycle. Tell your health care provider about any changes, such as: ? Increased blood flow that requires you to use more pads or tampons than usual. ? A change in the number of days that your period lasts. ? A change in symptoms that are associated with your period, such as back pain or cramps in your abdomen.  Keep all follow-up visits as told by your health care provider. This is important, especially if your fibroids need to be monitored for any changes. Contact a health care provider if you:  Have pelvic pain, back pain, or cramps in your abdomen that do not get better with medicine or heat.  Develop new bleeding between periods.  Have increased bleeding during or between  periods.  Feel unusually tired or weak.  Feel light-headed. Get help right away if you:  Faint.  Have pelvic pain that suddenly gets worse.  Have severe vaginal bleeding that soaks a tampon or pad in 30 minutes or less. Summary  Uterine fibroids are noncancerous (benign) tumors that can develop in the uterus.  The exact cause of this condition is not known.  Most fibroids do not require medical treatment unless they affect your ability to have children (fertility).  Contact a health care provider if you have pelvic pain, back pain, or cramps in your abdomen that do not get better with medicines.  Make sure you know what symptoms should cause you to get help right away. This information is not intended to replace advice given to you by your health care provider. Make sure you discuss any questions you have with your health care provider. Document Revised: 06/06/2017 Document Reviewed: 05/20/2017 Elsevier Patient Education  2020 [employer].

## 2020-01-25 ENCOUNTER — Telehealth: Payer: Self-pay

## 2020-01-25 ENCOUNTER — Ambulatory Visit (INDEPENDENT_AMBULATORY_CARE_PROVIDER_SITE_OTHER): Payer: 59 | Admitting: Dermatology

## 2020-01-25 ENCOUNTER — Other Ambulatory Visit: Payer: Self-pay

## 2020-01-25 DIAGNOSIS — B351 Tinea unguium: Secondary | ICD-10-CM

## 2020-01-25 DIAGNOSIS — L609 Nail disorder, unspecified: Secondary | ICD-10-CM

## 2020-01-25 NOTE — Telephone Encounter (Signed)
Pt is on Norethindrone and her Dermatologist has her on Fluconazole once a week. Is it ok to take both? Will the Fluconazole make the [facility] weak? Please advise since [facility] is not in the office

## 2020-01-25 NOTE — Telephone Encounter (Signed)
Ok to take both. No interaction between pills and diflucan.

## 2020-01-25 NOTE — Telephone Encounter (Signed)
Pt aware.

## 2020-01-25 NOTE — Patient Instructions (Signed)
Recommend daily broad spectrum sunscreen SPF 30+ to sun-exposed areas, reapply every 2 hours as needed. Call for new or changing lesions.  

## 2020-01-25 NOTE — Progress Notes (Signed)
   Follow-Up Visit   Subjective  Tiffany Frey is a 44 y.o. female who presents for the following: Follow-up.  Patient here today for 3 month follow up. She is being treated for onychomycosis of the toenails and is using Jublia and was taking fluconazole. She is also treating her right thumb nail with gentamicin and was taking fluconazole for possible pseudomonas/candida. Patient's last dose of fluconazole was 01/14/2020. No side effects noticed and nails are much better.  She started a new birth control and was told by pharmacist the fluconazole could reduce the effectiveness of the birth control, so she has stopped it.  The following portions of the chart were reviewed this encounter and updated as appropriate:      Review of Systems:  No other skin or systemic complaints except as noted in HPI or Assessment and Plan.  Objective  Well appearing patient in no apparent distress; mood and affect are within normal limits.  A focused examination was performed including right thumb, toenails. Relevant physical exam findings are noted in the Assessment and Plan.  Objective  Right 3rd, left great and 3rd toenails: L great toenail thickening with yellow discoloration, 30% clearance at base, other nails are clear  Images      Objective  Right thumb nail: Nail dystrophy at lateral nail fold with some yellow brown discoloration distal- much improved when compared to previous photo.  Dark discoloration has cleared.  Images     Assessment & Plan  Onychomycosis Right 3rd, left great and 3rd toenails  Improving Cont Jublia QD  Patient will check with gynecologist to confirm if she can continue fluconazole 200mg  1 po qwk without it affecting the efficacy of her birth control. If so, then continue until gone ~ 3 more months.  Nail problem Right thumb nail  Due to Pseudomonas/Candida- improving Continue gentamicin daily to affected nail  Patient will check with gynecologist to  confirm if she can continue fluconazole 200mg  1 po qwk without it affecting the efficacy of her birth control.   If she does not continue the fluconazole she can start Jublia daily to right thumb nail.  Gentamicin Sulfate (GENTAMICIN FORTIFIED) 7 ml SOLN - Right thumb nail  Return in about 6 months (around 07/27/2020) for onychomycosis, nail problem.  [name], RMA, am acting as scribe for [name], MD . Documentation: I have reviewed the above documentation for accuracy and completeness, and I agree with the above.  [name] MD

## 2020-03-06 ENCOUNTER — Encounter: Payer: PRIVATE HEALTH INSURANCE | Admitting: Nurse Practitioner

## 2020-03-16 ENCOUNTER — Other Ambulatory Visit: Payer: Self-pay

## 2020-03-16 ENCOUNTER — Encounter: Payer: Self-pay | Admitting: Obstetrics & Gynecology

## 2020-03-16 ENCOUNTER — Telehealth (INDEPENDENT_AMBULATORY_CARE_PROVIDER_SITE_OTHER): Payer: 59 | Admitting: Obstetrics & Gynecology

## 2020-03-16 DIAGNOSIS — D219 Benign neoplasm of connective and other soft tissue, unspecified: Secondary | ICD-10-CM | POA: Diagnosis not present

## 2020-03-16 DIAGNOSIS — R102 Pelvic and perineal pain: Secondary | ICD-10-CM | POA: Diagnosis not present

## 2020-03-16 NOTE — Progress Notes (Signed)
Virtual Visit via Video Note  I connected with Tiffany Frey on 03/16/20 at  3:10 PM EDT by a video enabled telemedicine application and verified that I am speaking with the correct person using two identifiers.  Location: Patient: Home Provider: Office   I discussed the limitations of evaluation and management by telemedicine and the availability of in person appointments. The patient expressed understanding and agreed to proceed.  History of Present Illness:  Tiffany Frey is a 44 y.o. who was started on POP after having IUD removed (due to malposition related to her fibroids)  approximately 2 months ago. Since that time, she states that her symptoms of bleeding has improved but she continues with pressure and pain, pain the is radiating to back and to vagina, worse w movement.  She has known 5 fibroids from [place] in April and prior exams.  PMHx: She  has a past medical history of Anxiety, Fibroids, dysplastic nevus (11/18/2018), Spontaneous abortion, and Thyroid disease. Also,  has a past surgical history that includes laparoscopy and Uterine fibroid embolization., family history includes COPD in her father; Healthy in her mother; Heart attack (age of onset: 72) in her paternal grandfather.,  reports that she has never smoked. She has never used smokeless tobacco. She reports current alcohol use of about 1.0 - 2.0 standard drink of alcohol per week. She reports that she does not use drugs. No outpatient medications have been marked as taking for the 03/16/20 encounter (Appointment) with Gae Dry, MD.  . Also, is allergic to promethazine..  Review of Systems  All other systems reviewed and are negative.    Observations/Objective: No exam today, due to telephone eVisit due to [facility] virus restriction on elective visits and procedures.  Prior visits reviewed along with ultrasounds/labs as indicated.  Assessment and Plan:   ICD-10-CM   1. Pelvic pain  R10.2   2. Fibroid  D21.9    Options counseled again as far as fibroid tx    She has pressure and pain sx's    She has already had [place] and attempted myomectomy in past    She does not desire fertility Plan- [facility] BS (preservation of ovaries); all discussed w pros and cons of surgery To schedule  Follow Up Instructions: For preop   I discussed the assessment and treatment plan with the patient. The patient was provided an opportunity to ask questions and all were answered. The patient agreed with the plan and demonstrated an understanding of the instructions.   The patient was advised to call back or seek an in-person evaluation if the symptoms worsen or if the condition fails to improve as anticipated.  A total of 25 minutes were spent face-to-face with the patient as well as preparation, review, communication, and documentation during this encounter.   Barnett Applebaum, MD, [facility], [facility] 03/16/2020  3:30 PM

## 2020-03-16 NOTE — Patient Instructions (Signed)
Total Laparoscopic Hysterectomy A total laparoscopic hysterectomy is a minimally invasive surgery to remove the uterus and cervix. The fallopian tubes and ovaries can also be removed (bilateral salpingo-oophorectomy) during this surgery, if necessary. This procedure may be done to treat problems such as:  Noncancerous growths in the uterus (uterine fibroids) that cause symptoms.  A condition that causes the lining of the uterus (endometrium) to grow in other areas (endometriosis).  Problems with pelvic support. This is caused by weakened muscles of the pelvis following vaginal childbirth or menopause.  Cancer of the cervix, ovaries, uterus, or endometrium.  Excessive (dysfunctional) uterine bleeding. This surgery is performed by inserting a thin, lighted tube (laparoscope) and surgical instruments into small incisions in the abdomen. The laparoscope sends images to a monitor. The images help the health care provider perform the procedure. After this procedure, you will no longer be able to have a baby, and you will no longer have a menstrual period. Tell a health care provider about:  Any allergies you have.  All medicines you are taking, including vitamins, herbs, eye drops, creams, and over-the-counter medicines.  Any problems you or family members have had with anesthetic medicines.  Any blood disorders you have.  Any surgeries you have had.  Any medical conditions you have.  Whether you are pregnant or may be pregnant. What are the risks? Generally, this is a safe procedure. However, problems may occur, including:  Infection.  Bleeding.  Blood clots in the legs or lungs.  Allergic reactions to medicines.  Damage to other structures or organs.  The risk that the surgery may have to be switched to the regular one in which a large incision is made in the abdomen (abdominal hysterectomy). What happens before the procedure? Staying hydrated Follow instructions from your  health care provider about hydration, which may include:  Up to 2 hours before the procedure - you may continue to drink clear liquids, such as water, clear fruit juice, black coffee, and plain tea Eating and drinking restrictions Follow instructions from your health care provider about eating and drinking, which may include:  8 hours before the procedure - stop eating heavy meals or foods such as meat, fried foods, or fatty foods.  6 hours before the procedure - stop eating light meals or foods, such as toast or cereal.  6 hours before the procedure - stop drinking milk or drinks that contain milk.  2 hours before the procedure - stop drinking clear liquids. Medicines  Ask your health care provider about: ? Changing or stopping your regular medicines. This is especially important if you are taking diabetes medicines or blood thinners. ? Taking over-the-counter medicines, vitamins, herbs, and supplements. ? Taking medicines such as aspirin and ibuprofen. These medicines can thin your blood. Do not take these medicines unless your health care provider tells you to take them.  You may be given antibiotic medicine to help prevent infection.  You may be asked to take laxatives.  You may be given medicines to help prevent nausea and vomiting after the procedure. General instructions  Ask your health care provider how your surgical site will be marked or identified.  You may be asked to shower with a germ-killing soap.  Do not use any products that contain nicotine or tobacco, such as cigarettes and e-cigarettes. If you need help quitting, ask your health care provider.  You may have an exam or testing, such as an ultrasound to determine the size and shape of your pelvic organs.    You may have a blood or urine sample taken.  This procedure can affect the way you feel about yourself. Talk with your health care provider about the physical and emotional changes hysterectomy may  cause.  Plan to have someone take you home from the hospital or clinic.  Plan to have a responsible adult care for you for at least 24 hours after you leave the hospital or clinic. This is important. What happens during the procedure?  To lower your risk of infection: ? Your health care team will wash or sanitize their hands. ? Your skin will be washed with soap. ? Hair may be removed from the surgical area.  An IV will be inserted into one of your veins.  You will be given one or more of the following: ? A medicine to help you relax (sedative). ? A medicine to make you fall asleep (general anesthetic).  You will be given antibiotic medicine through your IV.  A tube may be inserted down your throat to help you breathe during the procedure.  A gas (carbon dioxide) will be used to inflate your abdomen to allow your surgeon to see inside of your abdomen.  Three or four small incisions will be made in your abdomen.  A laparoscope will be inserted into one of your incisions. Surgical instruments will be inserted through the other incisions in order to perform the procedure.  Your uterus and cervix may be removed through your vagina or cut into small pieces and removed through the small incisions. Any other organs that need to be removed will also be removed this way.  Carbon dioxide will be released from inside of your abdomen.  Your incisions will be closed with stitches (sutures).  A bandage (dressing) may be placed over your incisions. The procedure may vary among health care providers and hospitals. What happens after the procedure?  Your blood pressure, heart rate, breathing rate, and blood oxygen level will be monitored until the medicines you were given have worn off.  You will be given medicine for pain and nausea as needed.  Do not drive for 24 hours if you received a sedative. Summary  Total Laparoscopic hysterectomy is a procedure to remove your uterus, cervix and  sometimes the fallopian tubes and ovaries.  This procedure can affect the way you feel about yourself. Talk with your health care provider about the physical and emotional changes hysterectomy may cause.  After this procedure, you will no longer be able to have a baby, and you will no longer have a menstrual period.  You will be given pain medicine to control discomfort after this procedure. This information is not intended to replace advice given to you by your health care provider. Make sure you discuss any questions you have with your health care provider. Document Revised: 06/06/2017 Document Reviewed: 09/04/2016 Elsevier Patient Education  2020 [employer]  

## 2020-03-21 ENCOUNTER — Other Ambulatory Visit: Payer: Self-pay | Admitting: Obstetrics & Gynecology

## 2020-03-21 ENCOUNTER — Telehealth: Payer: Self-pay | Admitting: Obstetrics & Gynecology

## 2020-03-21 DIAGNOSIS — R102 Pelvic and perineal pain: Secondary | ICD-10-CM

## 2020-03-21 DIAGNOSIS — D219 Benign neoplasm of connective and other soft tissue, unspecified: Secondary | ICD-10-CM

## 2020-03-21 NOTE — Telephone Encounter (Signed)
Called patient to schedule TLH/BS w [facility]  DOS 9/30  H&P 9/23 @ 11:20   Covid testing 9/28 @ 8-10:30, [address], drive up and wear mask. Advised pt to quarantine until DOS.  Pre-admit phone call appointment to be requested - date and time will be included on H&P paper work. Also all appointments will be updated on pt MyChart. Explained that this appointment has a call window. Based on the time scheduled will indicate if the call will be received within a 4 hour window before 1:00 or after.  Advised that pt may also receive calls from the hospital pharmacy and pre-service center.  Confirmed pt has [employer] primary insurance. No secondary insurance.

## 2020-03-21 NOTE — Telephone Encounter (Signed)
Surgery Booking Request Patient Full Name:  Tiffany Frey  MRN: [id number]  DOB: Dec 10, 1975  Surgeon: Hoyt Koch, MD  Requested Surgery Date and Time: Oct 7 Primary Diagnosis AND Code:   ICD-10-CM  1. Pelvic pain  R10.2  2. Fibroid  D21.9  Secondary Diagnosis and Code:  Surgical Procedure: TLH/BS RNFA Requested?: No L&D Notification: No Admission Status: same day surgery Length of Surgery: 75 min Special Case Needs: No H&P: Yes Phone Interview???:  Yes Interpreter: No Medical Clearance:  No Special Scheduling Instructions: No Any known health/anesthesia issues, diabetes, sleep apnea, latex allergy, defibrillator/pacemaker?: No Acuity: P3   (P1 highest, P2 delay may cause harm, P3 low, elective gyn, P4 lowest)

## 2020-03-21 NOTE — Telephone Encounter (Signed)
-----   Message from Gae Dry, MD sent at 03/16/2020  3:29 PM EDT ----- Regarding: SURGERY Surgery Booking Request Patient Full Name:  Tiffany Frey  MRN: [id number]  DOB: 03-31-1976  Surgeon: Hoyt Koch, MD  Requested Surgery Date and Time: Oct7, sept 30, as available Primary Diagnosis AND Code: Fibroids, Pelvic pain Secondary Diagnosis and Code:   ICD-10-CM  1. Pelvic pain  R10.2  2. Fibroid  D21.9  Surgical Procedure: TLH/BS RNFA Requested?: No L&D Notification: No Admission Status: same day surgery Length of Surgery: 75 min Special Case Needs: No H&P: Yes Phone Interview???:  Yes Interpreter: No Medical Clearance:  No Special Scheduling Instructions: No Any known health/anesthesia issues, diabetes, sleep apnea, latex allergy, defibrillator/pacemaker?: No Acuity: P3   (P1 highest, P2 delay may cause harm, P3 low, elective gyn, P4 lowest)

## 2020-03-30 ENCOUNTER — Encounter: Payer: Self-pay | Admitting: Obstetrics & Gynecology

## 2020-03-30 ENCOUNTER — Other Ambulatory Visit: Payer: Self-pay

## 2020-03-30 ENCOUNTER — Ambulatory Visit (INDEPENDENT_AMBULATORY_CARE_PROVIDER_SITE_OTHER): Payer: 59 | Admitting: Obstetrics & Gynecology

## 2020-03-30 VITALS — BP 120/80 | Ht 66.0 in | Wt 139.0 lb

## 2020-03-30 DIAGNOSIS — R102 Pelvic and perineal pain: Secondary | ICD-10-CM

## 2020-03-30 DIAGNOSIS — R1031 Right lower quadrant pain: Secondary | ICD-10-CM

## 2020-03-30 DIAGNOSIS — D219 Benign neoplasm of connective and other soft tissue, unspecified: Secondary | ICD-10-CM

## 2020-03-30 NOTE — Patient Instructions (Signed)

## 2020-03-30 NOTE — H&P (View-Only) (Signed)
PRE-OPERATIVE HISTORY AND PHYSICAL EXAM  HPI:  Tiffany Frey is a 44 y.o. G2P0020 No LMP recorded. (Menstrual status: IUD).; she is being admitted for surgery related to fibroids and pelvic pain.  She continues with pressure and pain, pain that is radiating to back and to vagina, worse w movement.  She has known 5 fibroids from [place] in April and prior exams.  Prior [place] and myomectomy surgery.  No fertility desired.  PMHx: Past Medical History:  Diagnosis Date  . Anxiety   . Fibroids   . Hx of dysplastic nevus 11/18/2018   L low back paraspinal above sacral area  . Spontaneous abortion   . Thyroid disease    Past Surgical History:  Procedure Laterality Date  . LAPAROSCOPY    . UTERINE FIBROID EMBOLIZATION     Family History  Problem Relation Age of Onset  . COPD Father   . Healthy Mother   . Heart attack Paternal Grandfather 84  . Breast cancer Neg Hx    Social History   Tobacco Use  . Smoking status: Never Smoker  . Smokeless tobacco: Never Used  Vaping Use  . Vaping Use: Never used  Substance Use Topics  . Alcohol use: Yes    Alcohol/week: 1.0 - 2.0 standard drink    Types: 1 - 2 Cans of beer per week    Comment: 1-2 x per month  . Drug use: No    Current Outpatient Medications:  .  ALPRAZolam (XANAX) 0.25 MG tablet, Take 1 tablet (0.25 mg total) by mouth 2 (two) times daily as needed for anxiety., Disp: 20 tablet, Rfl: 0 .  Efinaconazole (JUBLIA) 10 % SOLN, Apply 1 application topically at bedtime. (Patient taking differently: Apply 1 application topically See admin instructions. 1 drop on toe nail in the morning), Disp: 8 mL, Rfl: 5 .  escitalopram (LEXAPRO) 10 MG tablet, Take 0.5 tablets (5 mg total) by mouth daily. (Patient taking differently: Take 5 mg by mouth at bedtime. ), Disp: 45 tablet, Rfl: 12 .  fluconazole (DIFLUCAN) 200 MG tablet, Take 1 tablet (200 mg total) by mouth daily. (Patient taking differently: Take 200 mg by mouth once a week. Sunday),  Disp: 30 tablet, Rfl: 0 .  Gentamicin Sulfate (GENTAMICIN FORTIFIED) 7 ml SOLN, Apply to affected areas at nail daily., Disp: 7 mL, Rfl: 1 .  levothyroxine (SYNTHROID) 50 MCG tablet, TAKE ONE TABLET BY MOUTH IN THE MORNING 30  MINUTES  BEFORE  BREAKFAST (Patient taking differently: Take 50 mcg by mouth daily before breakfast. ), Disp: 90 tablet, Rfl: 1 .  norethindrone (MICRONOR) 0.35 MG tablet, Take 1 tablet (0.35 mg total) by mouth daily., Disp: 90 tablet, Rfl: 3 .  Tavaborole (KERYDIN) 5 % SOLN, Apply every evening to affected nails. (Patient not taking: Reported on 03/23/2020), Disp: 10 mL, Rfl: 11 Allergies: Promethazine  Review of Systems  Constitutional: Negative for chills, fever and malaise/fatigue.  HENT: Negative for congestion, sinus pain and sore throat.   Eyes: Negative for blurred vision and pain.  Respiratory: Negative for cough and wheezing.   Cardiovascular: Negative for chest pain and leg swelling.  Gastrointestinal: Negative for abdominal pain, constipation, diarrhea, heartburn, nausea and vomiting.  Genitourinary: Negative for dysuria, frequency, hematuria and urgency.  Musculoskeletal: Negative for back pain, joint pain, myalgias and neck pain.  Skin: Negative for itching and rash.  Neurological: Negative for dizziness, tremors and weakness.  Endo/Heme/Allergies: Does not bruise/bleed easily.  Psychiatric/Behavioral: Negative for depression. The  patient is not nervous/anxious and does not have insomnia.     Objective: BP 120/80   Ht 5\' 6"  (1.676 m)   Wt 139 lb (63 kg)   BMI 22.44 kg/m   Filed Weights   03/30/20 1113  Weight: 139 lb (63 kg)   Physical Exam Constitutional:      General: She is not in acute distress.    Appearance: She is well-developed.  HENT:     Head: Normocephalic and atraumatic. No laceration.     Right Ear: Hearing normal.     Left Ear: Hearing normal.     Mouth/Throat:     Pharynx: Uvula midline.  Eyes:     Pupils: Pupils are equal,  round, and reactive to light.  Neck:     Thyroid: No thyromegaly.  Cardiovascular:     Rate and Rhythm: Normal rate and regular rhythm.     Heart sounds: No murmur heard.  No friction rub. No gallop.   Pulmonary:     Effort: Pulmonary effort is normal. No respiratory distress.     Breath sounds: Normal breath sounds. No wheezing.  Chest:     Breasts:        Right: No mass, skin change or tenderness.        Left: No mass, skin change or tenderness.  Abdominal:     General: Bowel sounds are normal. There is no distension.     Palpations: Abdomen is soft.     Tenderness: There is no abdominal tenderness. There is no rebound.  Musculoskeletal:        General: Normal range of motion.     Cervical back: Normal range of motion and neck supple.  Neurological:     Mental Status: She is alert and oriented to person, place, and time.     Cranial Nerves: No cranial nerve deficit.  Skin:    General: Skin is warm and dry.  Psychiatric:        Judgment: Judgment normal.  Vitals reviewed.     Assessment: 1. Pelvic pain   2. Fibroid   3. RLQ abdominal pain   Plan TLH BS Preservation of ovaries Recovery discussed  I have had a careful discussion with this patient about all the options available and the risk/benefits of each. I have fully informed this patient that surgery may subject her to a variety of discomforts and risks: She understands that most patients have surgery with little difficulty, but problems can happen ranging from minor to fatal. These include nausea, vomiting, pain, bleeding, infection, poor healing, hernia, or formation of adhesions. Unexpected reactions may occur from any drug or anesthetic given. Unintended injury may occur to other pelvic or abdominal structures such as Fallopian tubes, ovaries, bladder, ureter (tube from kidney to bladder), or bowel. Nerves going from the pelvis to the legs may be injured. Any such injury may require immediate or later additional  surgery to correct the problem. Excessive blood loss requiring transfusion is very unlikely but possible. Dangerous blood clots may form in the legs or lungs. Physical and sexual activity will be restricted in varying degrees for an indeterminate period of time but most often 2-6 weeks.  Finally, she understands that it is impossible to list every possible undesirable effect and that the condition for which surgery is done is not always cured or significantly improved, and in rare cases may be even worse.Ample time was given to answer all questions.  Barnett Applebaum, MD, [facility], [facility] 03/30/2020  11:41 AM

## 2020-03-30 NOTE — Progress Notes (Signed)
PRE-OPERATIVE HISTORY AND PHYSICAL EXAM  HPI:  Tiffany Frey is a 44 y.o. G2P0020 No LMP recorded. (Menstrual status: IUD).; she is being admitted for surgery related to fibroids and pelvic pain.  She continues with pressure and pain, pain that is radiating to back and to vagina, worse w movement.  She has known 5 fibroids from [place] in April and prior exams.  Prior [place] and myomectomy surgery.  No fertility desired.  PMHx: Past Medical History:  Diagnosis Date   Anxiety    Fibroids    Hx of dysplastic nevus 11/18/2018   L low back paraspinal above sacral area   Spontaneous abortion    Thyroid disease    Past Surgical History:  Procedure Laterality Date   LAPAROSCOPY     UTERINE FIBROID EMBOLIZATION     Family History  Problem Relation Age of Onset   COPD Father    Healthy Mother    Heart attack Paternal Grandfather 5   Breast cancer Neg Hx    Social History   Tobacco Use   Smoking status: Never Smoker   Smokeless tobacco: Never Used  Vaping Use   Vaping Use: Never used  Substance Use Topics   Alcohol use: Yes    Alcohol/week: 1.0 - 2.0 standard drink    Types: 1 - 2 Cans of beer per week    Comment: 1-2 x per month   Drug use: No    Current Outpatient Medications:    ALPRAZolam (XANAX) 0.25 MG tablet, Take 1 tablet (0.25 mg total) by mouth 2 (two) times daily as needed for anxiety., Disp: 20 tablet, Rfl: 0   Efinaconazole (JUBLIA) 10 % SOLN, Apply 1 application topically at bedtime. (Patient taking differently: Apply 1 application topically See admin instructions. 1 drop on toe nail in the morning), Disp: 8 mL, Rfl: 5   escitalopram (LEXAPRO) 10 MG tablet, Take 0.5 tablets (5 mg total) by mouth daily. (Patient taking differently: Take 5 mg by mouth at bedtime. ), Disp: 45 tablet, Rfl: 12   fluconazole (DIFLUCAN) 200 MG tablet, Take 1 tablet (200 mg total) by mouth daily. (Patient taking differently: Take 200 mg by mouth once a week. Sunday),  Disp: 30 tablet, Rfl: 0   Gentamicin Sulfate (GENTAMICIN FORTIFIED) 7 ml SOLN, Apply to affected areas at nail daily., Disp: 7 mL, Rfl: 1   levothyroxine (SYNTHROID) 50 MCG tablet, TAKE ONE TABLET BY MOUTH IN THE MORNING 30  MINUTES  BEFORE  BREAKFAST (Patient taking differently: Take 50 mcg by mouth daily before breakfast. ), Disp: 90 tablet, Rfl: 1   norethindrone (MICRONOR) 0.35 MG tablet, Take 1 tablet (0.35 mg total) by mouth daily., Disp: 90 tablet, Rfl: 3   Tavaborole (KERYDIN) 5 % SOLN, Apply every evening to affected nails. (Patient not taking: Reported on 03/23/2020), Disp: 10 mL, Rfl: 11 Allergies: Promethazine  Review of Systems  Constitutional: Negative for chills, fever and malaise/fatigue.  HENT: Negative for congestion, sinus pain and sore throat.   Eyes: Negative for blurred vision and pain.  Respiratory: Negative for cough and wheezing.   Cardiovascular: Negative for chest pain and leg swelling.  Gastrointestinal: Negative for abdominal pain, constipation, diarrhea, heartburn, nausea and vomiting.  Genitourinary: Negative for dysuria, frequency, hematuria and urgency.  Musculoskeletal: Negative for back pain, joint pain, myalgias and neck pain.  Skin: Negative for itching and rash.  Neurological: Negative for dizziness, tremors and weakness.  Endo/Heme/Allergies: Does not bruise/bleed easily.  Psychiatric/Behavioral: Negative for depression. The  patient is not nervous/anxious and does not have insomnia.     Objective: BP 120/80    Ht 5\' 6"  (1.676 m)    Wt 139 lb (63 kg)    BMI 22.44 kg/m   Filed Weights   03/30/20 1113  Weight: 139 lb (63 kg)   Physical Exam Constitutional:      General: She is not in acute distress.    Appearance: She is well-developed.  HENT:     Head: Normocephalic and atraumatic. No laceration.     Right Ear: Hearing normal.     Left Ear: Hearing normal.     Mouth/Throat:     Pharynx: Uvula midline.  Eyes:     Pupils: Pupils are equal,  round, and reactive to light.  Neck:     Thyroid: No thyromegaly.  Cardiovascular:     Rate and Rhythm: Normal rate and regular rhythm.     Heart sounds: No murmur heard.  No friction rub. No gallop.   Pulmonary:     Effort: Pulmonary effort is normal. No respiratory distress.     Breath sounds: Normal breath sounds. No wheezing.  Chest:     Breasts:        Right: No mass, skin change or tenderness.        Left: No mass, skin change or tenderness.  Abdominal:     General: Bowel sounds are normal. There is no distension.     Palpations: Abdomen is soft.     Tenderness: There is no abdominal tenderness. There is no rebound.  Musculoskeletal:        General: Normal range of motion.     Cervical back: Normal range of motion and neck supple.  Neurological:     Mental Status: She is alert and oriented to person, place, and time.     Cranial Nerves: No cranial nerve deficit.  Skin:    General: Skin is warm and dry.  Psychiatric:        Judgment: Judgment normal.  Vitals reviewed.     Assessment: 1. Pelvic pain   2. Fibroid   3. RLQ abdominal pain   Plan TLH BS Preservation of ovaries Recovery discussed  I have had a careful discussion with this patient about all the options available and the risk/benefits of each. I have fully informed this patient that surgery may subject her to a variety of discomforts and risks: She understands that most patients have surgery with little difficulty, but problems can happen ranging from minor to fatal. These include nausea, vomiting, pain, bleeding, infection, poor healing, hernia, or formation of adhesions. Unexpected reactions may occur from any drug or anesthetic given. Unintended injury may occur to other pelvic or abdominal structures such as Fallopian tubes, ovaries, bladder, ureter (tube from kidney to bladder), or bowel. Nerves going from the pelvis to the legs may be injured. Any such injury may require immediate or later additional  surgery to correct the problem. Excessive blood loss requiring transfusion is very unlikely but possible. Dangerous blood clots may form in the legs or lungs. Physical and sexual activity will be restricted in varying degrees for an indeterminate period of time but most often 2-6 weeks.  Finally, she understands that it is impossible to list every possible undesirable effect and that the condition for which surgery is done is not always cured or significantly improved, and in rare cases may be even worse.Ample time was given to answer all questions.  Barnett Applebaum, MD, [facility], [facility] 03/30/2020  11:41 AM

## 2020-03-31 ENCOUNTER — Other Ambulatory Visit: Payer: Self-pay

## 2020-03-31 ENCOUNTER — Encounter
Admission: RE | Admit: 2020-03-31 | Discharge: 2020-03-31 | Disposition: A | Discharge status: Home / Self Care | Payer: 59 | Source: Ambulatory Visit | Attending: Obstetrics & Gynecology | Admitting: Obstetrics & Gynecology

## 2020-03-31 HISTORY — DX: Hypothyroidism, unspecified: E03.9

## 2020-03-31 NOTE — Patient Instructions (Signed)
Your procedure is scheduled on: 04/06/20 Report to [facility]. To find out your arrival time please call [phone] between 1PM - 3PM on 04/05/20.  Remember: Instructions that are not followed completely may result in serious medical risk, up to and including death, or upon the discretion of your surgeon and anesthesiologist your surgery may need to be rescheduled.     _X__ 1. Do not eat food after midnight the night before your procedure.                 No gum chewing or hard candies. You may drink clear liquids up to 2 hours                 before you are scheduled to arrive for your surgery- DO not drink clear                 liquids within 2 hours of the start of your surgery.                 Clear Liquids include:  water, apple juice without pulp, clear carbohydrate                 drink such as Clearfast or Gatorade, Black Coffee or Tea (Do not add                 anything to coffee or tea). Diabetics water only  __X__2.  On the morning of surgery brush your teeth with toothpaste and water, you                 may rinse your mouth with mouthwash if you wish.  Do not swallow any              toothpaste of mouthwash.     _X__ 3.  No Alcohol for 24 hours before or after surgery.   _X__ 4.  Do Not Smoke or use e-cigarettes For 24 Hours Prior to Your Surgery.                 Do not use any chewable tobacco products for at least 6 hours prior to                 surgery.  ____  5.  Bring all medications with you on the day of surgery if instructed.   __X__  6.  Notify your doctor if there is any change in your medical condition      (cold, fever, infections).     Do not wear jewelry, make-up, hairpins, clips or nail polish. Do not wear lotions, powders, or perfumes.  Do not shave 48 hours prior to surgery. Men may shave face and neck. Do not bring valuables to the hospital.    [facility] is not responsible for any belongings or  valuables.  Contacts, dentures/partials or body piercings may not be worn into surgery. Bring a case for your contacts, glasses or hearing aids, a denture cup will be supplied. Leave your suitcase in the car. After surgery it may be brought to your room. For patients admitted to the hospital, discharge time is determined by your treatment team.   Patients discharged the day of surgery will not be allowed to drive home.   Please read over the following fact sheets that you were given:     __X__ Take these medicines the morning of surgery with A SIP OF WATER:  1. levothyroxine (SYNTHROID) 50 MCG tablet  2. ALPRAZolam (XANAX) 0.25 MG tablet if needed  3.   4.  5.  6.  ____ Fleet Enema (as directed)   __X__ Use CHG Soap/SAGE wipes as directed  ____ Use inhalers on the day of surgery  ____ Stop metformin/Janumet/Farxiga 2 days prior to surgery    ____ Take 1/2 of usual insulin dose the night before surgery. No insulin the morning          of surgery.   ____ Stop Blood Thinners Coumadin/Plavix/Xarelto/Pleta/Pradaxa/Eliquis/Effient/Aspirin  on   Or contact your Surgeon, Cardiologist or Medical Doctor regarding  ability to stop your blood thinners  __X__ Stop Anti-inflammatories 7 days before surgery such as Advil, Ibuprofen, Motrin,  BC or Goodies Powder, Naprosyn, Naproxen, Aleve, Aspirin    __X__ Stop all herbal supplements, fish oil or vitamin E until after surgery.    ____ Bring C-Pap to the hospital.      How to Use Chlorhexidine for Bathing Chlorhexidine gluconate (CHG) is a germ-killing (antiseptic) solution that is used to clean the skin. It can get rid of the bacteria that normally live on the skin and can keep them away for about 24 hours. To clean your skin with CHG, you may be given:  A CHG solution to use in the shower or as part of a sponge bath.  A prepackaged cloth that contains CHG. Cleaning your skin with CHG may help lower the risk for  infection:  While you are staying in the intensive care unit of the hospital.  If you have a vascular access, such as a central line, to provide short-term or long-term access to your veins.  If you have a catheter to drain urine from your bladder.  If you are on a ventilator. A ventilator is a machine that helps you breathe by moving air in and out of your lungs.  After surgery. What are the risks? Risks of using CHG include:  A skin reaction.  Hearing loss, if CHG gets in your ears.  Eye injury, if CHG gets in your eyes and is not rinsed out.  The CHG product catching fire. Make sure that you avoid smoking and flames after applying CHG to your skin. Do not use CHG:  If you have a chlorhexidine allergy or have previously reacted to chlorhexidine.  On babies younger than 9 months of age. How to use CHG solution  Use CHG only as told by your health care provider, and follow the instructions on the label.  Use the full amount of CHG as directed. Usually, this is one bottle. During a shower Follow these steps when using CHG solution during a shower (unless your health care provider gives you different instructions): 1. Start the shower. 2. Use your normal soap and shampoo to wash your face and hair. 3. Turn off the shower or move out of the shower stream. 4. Pour the CHG onto a clean washcloth. Do not use any type of brush or rough-edged sponge. 5. Starting at your neck, lather your body down to your toes. Make sure you follow these instructions: ? If you will be having surgery, pay special attention to the part of your body where you will be having surgery. Scrub this area for at least 1 minute. ? Do not use CHG on your head or face. If the solution gets into your ears or eyes, rinse them well with water. ? Avoid your genital area. ? Avoid any areas of skin that  have broken skin, cuts, or scrapes. ? Scrub your back and under your arms. Make sure to wash skin folds. 6. Let  the lather sit on your skin for 1-2 minutes or as long as told by your health care provider. 7. Thoroughly rinse your entire body in the shower. Make sure that all body creases and crevices are rinsed well. 8. Dry off with a clean towel. Do not put any substances on your body afterward--such as powder, lotion, or perfume--unless you are told to do so by your health care provider. Only use lotions that are recommended by the manufacturer. 9. Put on clean clothes or pajamas. 10. If it is the night before your surgery, sleep in clean sheets.  During a sponge bath Follow these steps when using CHG solution during a sponge bath (unless your health care provider gives you different instructions): 1. Use your normal soap and shampoo to wash your face and hair. 2. Pour the CHG onto a clean washcloth. 3. Starting at your neck, lather your body down to your toes. Make sure you follow these instructions: ? If you will be having surgery, pay special attention to the part of your body where you will be having surgery. Scrub this area for at least 1 minute. ? Do not use CHG on your head or face. If the solution gets into your ears or eyes, rinse them well with water. ? Avoid your genital area. ? Avoid any areas of skin that have broken skin, cuts, or scrapes. ? Scrub your back and under your arms. Make sure to wash skin folds. 4. Let the lather sit on your skin for 1-2 minutes or as long as told by your health care provider. 5. Using a different clean, wet washcloth, thoroughly rinse your entire body. Make sure that all body creases and crevices are rinsed well. 6. Dry off with a clean towel. Do not put any substances on your body afterward--such as powder, lotion, or perfume--unless you are told to do so by your health care provider. Only use lotions that are recommended by the manufacturer. 7. Put on clean clothes or pajamas. 8. If it is the night before your surgery, sleep in clean sheets. How to use CHG  prepackaged cloths  Only use CHG cloths as told by your health care provider, and follow the instructions on the label.  Use the CHG cloth on clean, dry skin.  Do not use the CHG cloth on your head or face unless your health care provider tells you to.  When washing with the CHG cloth: ? Avoid your genital area. ? Avoid any areas of skin that have broken skin, cuts, or scrapes. Before surgery Follow these steps when using a CHG cloth to clean before surgery (unless your health care provider gives you different instructions): 1. Using the CHG cloth, vigorously scrub the part of your body where you will be having surgery. Scrub using a back-and-forth motion for 3 minutes. The area on your body should be completely wet with CHG when you are done scrubbing. 2. Do not rinse. Discard the cloth and let the area air-dry. Do not put any substances on the area afterward, such as powder, lotion, or perfume. 3. Put on clean clothes or pajamas. 4. If it is the night before your surgery, sleep in clean sheets.  For general bathing Follow these steps when using CHG cloths for general bathing (unless your health care provider gives you different instructions). 1. Use a separate CHG cloth for  each area of your body. Make sure you wash between any folds of skin and between your fingers and toes. Wash your body in the following order, switching to a new cloth after each step: ? The front of your neck, shoulders, and chest. ? Both of your arms, under your arms, and your hands. ? Your stomach and groin area, avoiding the genitals. ? Your right leg and foot. ? Your left leg and foot. ? The back of your neck, your back, and your buttocks. 2. Do not rinse. Discard the cloth and let the area air-dry. Do not put any substances on your body afterward--such as powder, lotion, or perfume--unless you are told to do so by your health care provider. Only use lotions that are recommended by the manufacturer. 3. Put on  clean clothes or pajamas. Contact a health care provider if:  Your skin gets irritated after scrubbing.  You have questions about using your solution or cloth. Get help right away if:  Your eyes become very red or swollen.  Your eyes itch badly.  Your skin itches badly and is red or swollen.  Your hearing changes.  You have trouble seeing.  You have swelling or tingling in your mouth or throat.  You have trouble breathing.  You swallow any chlorhexidine. Summary  Chlorhexidine gluconate (CHG) is a germ-killing (antiseptic) solution that is used to clean the skin. Cleaning your skin with CHG may help to lower your risk for infection.  You may be given CHG to use for bathing. It may be in a bottle or in a prepackaged cloth to use on your skin. Carefully follow your health care provider's instructions and the instructions on the product label.  Do not use CHG if you have a chlorhexidine allergy.  Contact your health care provider if your skin gets irritated after scrubbing. This information is not intended to replace advice given to you by your health care provider. Make sure you discuss any questions you have with your health care provider. Document Revised: 09/10/2018 Document Reviewed: 05/22/2017 Elsevier Patient Education  [place].    How to Use an Incentive Spirometer An incentive spirometer is a tool that measures how well you are filling your lungs with each breath. Learning to take long, deep breaths using this tool can help you keep your lungs clear and active. This may help to reverse or lessen your chance of developing breathing (pulmonary) problems, especially infection. You may be asked to use a spirometer:  After a surgery.  If you have a lung problem or a history of smoking.  After a long period of time when you have been unable to move or be active. If the spirometer includes an indicator to show the highest number that you have reached, your  health care provider or respiratory therapist will help you set a goal. Keep a list (log) of your progress as told by your health care provider. What are the risks?  Breathing too quickly may cause dizziness or cause you to pass out. Take your time so you do not get dizzy or light-headed.  If you are in pain, you may need to take pain medicine before doing incentive spirometry. It is harder to take a deep breath if you are having pain. How to use your incentive spirometer  1. Sit up on the edge of your bed or on a chair. 2. Hold the incentive spirometer so that it is in an upright position. 3. Before you use the spirometer,  breathe out normally. 4. Place the mouthpiece in your mouth. Make sure your lips are closed tightly around it. 5. Breathe in slowly and as deeply as you can through your mouth, causing the piston or the ball to rise toward the top of the chamber. 6. Hold your breath for 3-5 seconds, or for as long as possible. ? If the spirometer includes a coach indicator, use this to guide you in breathing. Slow down your breathing if the indicator goes above the marked areas. 7. Remove the mouthpiece from your mouth and breathe out normally. The piston or ball will return to the bottom of the chamber. 8. Rest for a few seconds, then repeat the steps 10 or more times. ? Take your time and take a few normal breaths between deep breaths so that you do not get dizzy or light-headed. ? Do this every 1-2 hours when you are awake. 9. If the spirometer includes a goal marker to show the highest number you have reached (best effort), use this as a goal to work toward during each repetition. 10. After each set of 10 deep breaths, cough a few times. This will help to make sure that your lungs are clear. ? If you have an incision on your chest or abdomen from surgery, place a pillow or a rolled-up towel firmly against the incision when you cough. This can help to reduce pain from coughing. General  tips  When you become able to get out of bed, walk around often and continue to cough to help clear your lungs.  Keep using the incentive spirometer until your health care provider says it is okay to stop using it. If you have been in the hospital, you may be told to keep using the spirometer at home. Contact a health care provider if:  You are having difficulty using the spirometer.  You have trouble using the spirometer as often as instructed.  Your pain medicine is not giving enough relief for you to use the spirometer as told.  You have a fever.  You develop shortness of breath. Get help right away if:  You develop a cough with bloody mucus from the lungs (bloody sputum).  You have fluid or blood coming from an incision site after you cough. Summary  An incentive spirometer is a tool that can help you learn to take long, deep breaths to keep your lungs clear and active.  You may be asked to use a spirometer after a surgery, if you have a lung problem or a history of smoking, or if you have been inactive for a long period of time.  Use your incentive spirometer as instructed every 1-2 hours while you are awake.  If you have an incision on your chest or abdomen, place a pillow or a rolled-up towel firmly against your incision when you cough. This will help to reduce pain. This information is not intended to replace advice given to you by your health care provider. Make sure you discuss any questions you have with your health care provider. Document Revised: 01/22/2019 Document Reviewed: 05/07/2017 Elsevier Patient Education  2020 [employer].

## 2020-04-04 ENCOUNTER — Other Ambulatory Visit
Admission: RE | Admit: 2020-04-04 | Discharge: 2020-04-04 | Disposition: A | Discharge status: Home / Self Care | Payer: 59 | Source: Ambulatory Visit | Attending: Obstetrics & Gynecology | Admitting: Obstetrics & Gynecology

## 2020-04-04 ENCOUNTER — Other Ambulatory Visit: Payer: Self-pay

## 2020-04-04 DIAGNOSIS — Z20822 Contact with and (suspected) exposure to covid-19: Secondary | ICD-10-CM | POA: Diagnosis not present

## 2020-04-04 DIAGNOSIS — Z01812 Encounter for preprocedural laboratory examination: Secondary | ICD-10-CM | POA: Insufficient documentation

## 2020-04-04 LAB — TYPE AND SCREEN
ABO/RH(D): A POS
Antibody Screen: NEGATIVE

## 2020-04-04 LAB — CBC
HCT: 39.9 % (ref 36.0–46.0)
Hemoglobin: 14.2 g/dL (ref 12.0–15.0)
MCH: 33.2 pg (ref 26.0–34.0)
MCHC: 35.6 g/dL (ref 30.0–36.0)
MCV: 93.2 fL (ref 80.0–100.0)
Platelets: 233 10*3/uL (ref 150–400)
RBC: 4.28 MIL/uL (ref 3.87–5.11)
RDW: 12.9 % (ref 11.5–15.5)
WBC: 7.7 10*3/uL (ref 4.0–10.5)
nRBC: 0 % (ref 0.0–0.2)

## 2020-04-04 LAB — SARS CORONAVIRUS 2 (TAT 6-24 HRS): SARS Coronavirus 2: NEGATIVE

## 2020-04-06 ENCOUNTER — Ambulatory Visit: Payer: 59 | Admitting: Certified Registered"

## 2020-04-06 ENCOUNTER — Encounter
Admission: RE | Disposition: A | Discharge status: Home / Self Care | Payer: Self-pay | Source: Home / Self Care | Attending: Obstetrics & Gynecology

## 2020-04-06 ENCOUNTER — Other Ambulatory Visit: Payer: Self-pay

## 2020-04-06 ENCOUNTER — Ambulatory Visit
Admission: RE | Admit: 2020-04-06 | Discharge: 2020-04-06 | Disposition: A | Discharge status: Home / Self Care | Payer: 59 | Attending: Obstetrics & Gynecology | Admitting: Obstetrics & Gynecology

## 2020-04-06 ENCOUNTER — Encounter: Payer: Self-pay | Admitting: Obstetrics & Gynecology

## 2020-04-06 DIAGNOSIS — N838 Other noninflammatory disorders of ovary, fallopian tube and broad ligament: Secondary | ICD-10-CM | POA: Diagnosis not present

## 2020-04-06 DIAGNOSIS — N736 Female pelvic peritoneal adhesions (postinfective): Secondary | ICD-10-CM

## 2020-04-06 DIAGNOSIS — Z888 Allergy status to other drugs, medicaments and biological substances status: Secondary | ICD-10-CM | POA: Diagnosis not present

## 2020-04-06 DIAGNOSIS — Z79899 Other long term (current) drug therapy: Secondary | ICD-10-CM | POA: Diagnosis not present

## 2020-04-06 DIAGNOSIS — D252 Subserosal leiomyoma of uterus: Secondary | ICD-10-CM

## 2020-04-06 DIAGNOSIS — E079 Disorder of thyroid, unspecified: Secondary | ICD-10-CM | POA: Insufficient documentation

## 2020-04-06 DIAGNOSIS — Z8249 Family history of ischemic heart disease and other diseases of the circulatory system: Secondary | ICD-10-CM | POA: Insufficient documentation

## 2020-04-06 DIAGNOSIS — N800 Endometriosis of uterus: Secondary | ICD-10-CM | POA: Diagnosis not present

## 2020-04-06 DIAGNOSIS — Z86018 Personal history of other benign neoplasm: Secondary | ICD-10-CM | POA: Diagnosis not present

## 2020-04-06 DIAGNOSIS — F419 Anxiety disorder, unspecified: Secondary | ICD-10-CM | POA: Diagnosis not present

## 2020-04-06 DIAGNOSIS — Z79890 Hormone replacement therapy (postmenopausal): Secondary | ICD-10-CM | POA: Insufficient documentation

## 2020-04-06 DIAGNOSIS — R102 Pelvic and perineal pain: Secondary | ICD-10-CM

## 2020-04-06 DIAGNOSIS — Z803 Family history of malignant neoplasm of breast: Secondary | ICD-10-CM | POA: Insufficient documentation

## 2020-04-06 DIAGNOSIS — D251 Intramural leiomyoma of uterus: Secondary | ICD-10-CM

## 2020-04-06 DIAGNOSIS — N888 Other specified noninflammatory disorders of cervix uteri: Secondary | ICD-10-CM

## 2020-04-06 DIAGNOSIS — N808 Other endometriosis: Secondary | ICD-10-CM

## 2020-04-06 HISTORY — PX: TOTAL LAPAROSCOPIC HYSTERECTOMY WITH SALPINGECTOMY: SHX6742

## 2020-04-06 HISTORY — PX: CYSTOSCOPY: SHX5120

## 2020-04-06 LAB — POCT PREGNANCY, URINE: Preg Test, Ur: NEGATIVE

## 2020-04-06 LAB — ABO/RH: ABO/RH(D): A POS

## 2020-04-06 SURGERY — HYSTERECTOMY, TOTAL, LAPAROSCOPIC, WITH SALPINGECTOMY
Anesthesia: General | Laterality: Bilateral

## 2020-04-06 MED ORDER — KETOROLAC TROMETHAMINE 30 MG/ML IJ SOLN
INTRAMUSCULAR | Status: AC
  Filled 2020-04-06: qty 1

## 2020-04-06 MED ORDER — EPHEDRINE SULFATE 50 MG/ML IJ SOLN
INTRAMUSCULAR | Status: DC | PRN
  Administered 2020-04-06 (×3): 5 mg via INTRAVENOUS

## 2020-04-06 MED ORDER — FENTANYL CITRATE (PF) 100 MCG/2ML IJ SOLN
INTRAMUSCULAR | Status: DC | PRN
Start: 2020-04-06 — End: 2020-04-06
  Administered 2020-04-06 (×2): 50 ug via INTRAVENOUS

## 2020-04-06 MED ORDER — DEXAMETHASONE SODIUM PHOSPHATE 10 MG/ML IJ SOLN
INTRAMUSCULAR | Status: DC | PRN
  Administered 2020-04-06: 10 mg via INTRAVENOUS

## 2020-04-06 MED ORDER — PROPOFOL 10 MG/ML IV BOLUS
INTRAVENOUS | Status: AC
  Filled 2020-04-06: qty 20

## 2020-04-06 MED ORDER — OXYCODONE-ACETAMINOPHEN 5-325 MG PO TABS
1.0000 | ORAL_TABLET | ORAL | 0 refills | Status: DC | PRN
Start: 2020-04-06 — End: 2020-04-12

## 2020-04-06 MED ORDER — ROCURONIUM BROMIDE 10 MG/ML (PF) SYRINGE
PREFILLED_SYRINGE | INTRAVENOUS | Status: AC
  Filled 2020-04-06: qty 10

## 2020-04-06 MED ORDER — LACTATED RINGERS IV SOLN: INTRAVENOUS | Status: DC

## 2020-04-06 MED ORDER — PROPOFOL 10 MG/ML IV BOLUS
INTRAVENOUS | Status: DC | PRN
  Administered 2020-04-06: 130 mg via INTRAVENOUS

## 2020-04-06 MED ORDER — DEXAMETHASONE SODIUM PHOSPHATE 10 MG/ML IJ SOLN
INTRAMUSCULAR | Status: AC
  Filled 2020-04-06: qty 1

## 2020-04-06 MED ORDER — OXYCODONE-ACETAMINOPHEN 5-325 MG PO TABS: 1.0000 | ORAL_TABLET | ORAL | Status: DC | PRN

## 2020-04-06 MED ORDER — DEXMEDETOMIDINE HCL IN NACL 200 MCG/50ML IV SOLN
INTRAVENOUS | Status: DC | PRN
  Administered 2020-04-06: 12 ug via INTRAVENOUS

## 2020-04-06 MED ORDER — SODIUM CHLORIDE 0.9 % IV SOLN
INTRAVENOUS | Status: AC
  Filled 2020-04-06: qty 2

## 2020-04-06 MED ORDER — LIDOCAINE HCL (CARDIAC) PF 100 MG/5ML IV SOSY
PREFILLED_SYRINGE | INTRAVENOUS | Status: DC | PRN
  Administered 2020-04-06: 80 mg via INTRAVENOUS

## 2020-04-06 MED ORDER — CHLORHEXIDINE GLUCONATE 0.12 % MT SOLN
15.0000 mL | Freq: Once | OROMUCOSAL | Status: AC
  Administered 2020-04-06: 15 mL via OROMUCOSAL

## 2020-04-06 MED ORDER — FENTANYL CITRATE (PF) 100 MCG/2ML IJ SOLN
INTRAMUSCULAR | Status: AC
  Filled 2020-04-06: qty 2

## 2020-04-06 MED ORDER — SEVOFLURANE IN SOLN
RESPIRATORY_TRACT | Status: AC
  Filled 2020-04-06: qty 250

## 2020-04-06 MED ORDER — GLYCOPYRROLATE 0.2 MG/ML IJ SOLN
INTRAMUSCULAR | Status: DC | PRN
  Administered 2020-04-06: .2 mg via INTRAVENOUS

## 2020-04-06 MED ORDER — BUPIVACAINE HCL (PF) 0.5 % IJ SOLN
INTRAMUSCULAR | Status: DC | PRN
  Administered 2020-04-06: 12 mL

## 2020-04-06 MED ORDER — FENTANYL CITRATE (PF) 100 MCG/2ML IJ SOLN
25.0000 ug | INTRAMUSCULAR | Status: DC | PRN
  Administered 2020-04-06 (×3): 25 ug via INTRAVENOUS

## 2020-04-06 MED ORDER — HYDROMORPHONE HCL 1 MG/ML IJ SOLN
INTRAMUSCULAR | Status: DC | PRN
  Administered 2020-04-06: 1 mg via INTRAVENOUS

## 2020-04-06 MED ORDER — SODIUM CHLORIDE 0.9 % IV SOLN
2.0000 g | INTRAVENOUS | Status: AC
  Administered 2020-04-06: 2 g via INTRAVENOUS

## 2020-04-06 MED ORDER — SUGAMMADEX SODIUM 200 MG/2ML IV SOLN
INTRAVENOUS | Status: DC | PRN
  Administered 2020-04-06: 400 mg via INTRAVENOUS

## 2020-04-06 MED ORDER — KETOROLAC TROMETHAMINE 30 MG/ML IJ SOLN
INTRAMUSCULAR | Status: DC | PRN
  Administered 2020-04-06: 30 mg via INTRAVENOUS

## 2020-04-06 MED ORDER — CHLORHEXIDINE GLUCONATE 0.12 % MT SOLN
OROMUCOSAL | Status: AC
  Filled 2020-04-06: qty 15

## 2020-04-06 MED ORDER — ACETAMINOPHEN 10 MG/ML IV SOLN
INTRAVENOUS | Status: DC | PRN
  Administered 2020-04-06: 1000 mg via INTRAVENOUS

## 2020-04-06 MED ORDER — MORPHINE SULFATE (PF) 2 MG/ML IV SOLN: 1.0000 mg | INTRAVENOUS | Status: DC | PRN

## 2020-04-06 MED ORDER — BUPIVACAINE HCL (PF) 0.5 % IJ SOLN
INTRAMUSCULAR | Status: AC
  Filled 2020-04-06: qty 30

## 2020-04-06 MED ORDER — ORAL CARE MOUTH RINSE: 15.0000 mL | Freq: Once | OROMUCOSAL | Status: AC

## 2020-04-06 MED ORDER — POVIDONE-IODINE 10 % EX SWAB
2.0000 "application " | Freq: Once | CUTANEOUS | Status: AC
  Administered 2020-04-06: 2 via TOPICAL

## 2020-04-06 MED ORDER — DEXMEDETOMIDINE (PRECEDEX) IN NS 20 MCG/5ML (4 MCG/ML) IV SYRINGE
PREFILLED_SYRINGE | INTRAVENOUS | Status: AC
  Filled 2020-04-06: qty 5

## 2020-04-06 MED ORDER — LIDOCAINE HCL (PF) 2 % IJ SOLN
INTRAMUSCULAR | Status: AC
  Filled 2020-04-06: qty 10

## 2020-04-06 MED ORDER — ONDANSETRON HCL 4 MG/2ML IJ SOLN
INTRAMUSCULAR | Status: AC
  Filled 2020-04-06: qty 4

## 2020-04-06 MED ORDER — FAMOTIDINE 20 MG PO TABS
ORAL_TABLET | ORAL | Status: AC
  Filled 2020-04-06: qty 1

## 2020-04-06 MED ORDER — ONDANSETRON HCL 4 MG/2ML IJ SOLN
INTRAMUSCULAR | Status: DC | PRN
  Administered 2020-04-06 (×2): 4 mg via INTRAVENOUS

## 2020-04-06 MED ORDER — ACETAMINOPHEN 650 MG RE SUPP
650.0000 mg | RECTAL | Status: DC | PRN
  Filled 2020-04-06: qty 1

## 2020-04-06 MED ORDER — FAMOTIDINE 20 MG PO TABS
20.0000 mg | ORAL_TABLET | Freq: Once | ORAL | Status: AC
  Administered 2020-04-06: 20 mg via ORAL

## 2020-04-06 MED ORDER — PHENYLEPHRINE HCL (PRESSORS) 10 MG/ML IV SOLN
INTRAVENOUS | Status: AC
  Filled 2020-04-06: qty 1

## 2020-04-06 MED ORDER — HYDROMORPHONE HCL 1 MG/ML IJ SOLN
INTRAMUSCULAR | Status: AC
  Filled 2020-04-06: qty 1

## 2020-04-06 MED ORDER — ACETAMINOPHEN 10 MG/ML IV SOLN
INTRAVENOUS | Status: AC
  Filled 2020-04-06: qty 100

## 2020-04-06 MED ORDER — FENTANYL CITRATE (PF) 100 MCG/2ML IJ SOLN
INTRAMUSCULAR | Status: AC
Start: 2020-04-06 — End: 2020-04-06
  Administered 2020-04-06: 25 ug via INTRAVENOUS
  Filled 2020-04-06: qty 2

## 2020-04-06 MED ORDER — ROCURONIUM BROMIDE 100 MG/10ML IV SOLN
INTRAVENOUS | Status: DC | PRN
  Administered 2020-04-06: 20 mg via INTRAVENOUS
  Administered 2020-04-06: 50 mg via INTRAVENOUS
  Administered 2020-04-06: 20 mg via INTRAVENOUS

## 2020-04-06 MED ORDER — MIDAZOLAM HCL 2 MG/2ML IJ SOLN
INTRAMUSCULAR | Status: AC
  Filled 2020-04-06: qty 2

## 2020-04-06 MED ORDER — MIDAZOLAM HCL 2 MG/2ML IJ SOLN
INTRAMUSCULAR | Status: DC | PRN
  Administered 2020-04-06: 2 mg via INTRAVENOUS

## 2020-04-06 MED ORDER — GLYCOPYRROLATE 0.2 MG/ML IJ SOLN
INTRAMUSCULAR | Status: AC
  Filled 2020-04-06: qty 1

## 2020-04-06 MED ORDER — ACETAMINOPHEN 325 MG PO TABS: 650.0000 mg | ORAL_TABLET | ORAL | Status: DC | PRN

## 2020-04-06 SURGICAL SUPPLY — 57 items
ADH SKN CLS APL DERMABOND .7 (GAUZE/BANDAGES/DRESSINGS) ×2
APL PRP STRL LF DISP 70% ISPRP (MISCELLANEOUS) ×2
BAG DRN RND TRDRP ANRFLXCHMBR (UROLOGICAL SUPPLIES) ×2
BAG URINE DRAIN 2000ML AR STRL (UROLOGICAL SUPPLIES) ×3 IMPLANT
BLADE SURG SZ11 CARB STEEL (BLADE) ×3 IMPLANT
CANISTER SUCT 1200ML W/VALVE (MISCELLANEOUS) ×3 IMPLANT
CATH FOLEY 2WAY  5CC 16FR (CATHETERS) ×3
CATH FOLEY 2WAY 5CC 16FR (CATHETERS) ×2
CATH URTH 16FR FL 2W BLN LF (CATHETERS) ×2 IMPLANT
CHLORAPREP W/TINT 26 (MISCELLANEOUS) ×3 IMPLANT
COVER WAND RF STERILE (DRAPES) ×3 IMPLANT
DEFOGGER SCOPE WARMER CLEARIFY (MISCELLANEOUS) ×3 IMPLANT
DERMABOND ADVANCED (GAUZE/BANDAGES/DRESSINGS) ×1
DERMABOND ADVANCED .7 DNX12 (GAUZE/BANDAGES/DRESSINGS) ×2 IMPLANT
DEVICE SUTURE ENDOST 10MM (ENDOMECHANICALS) ×1 IMPLANT
DRAPE CAMERA CLOSED 9X96 (DRAPES) ×3 IMPLANT
DRSG TEGADERM 2-3/8X2-3/4 SM (GAUZE/BANDAGES/DRESSINGS) IMPLANT
GAUZE 4X4 16PLY RFD (DISPOSABLE) ×3 IMPLANT
GLOVE BIO SURGEON STRL SZ8 (GLOVE) ×18 IMPLANT
GLOVE INDICATOR 8.0 STRL GRN (GLOVE) ×14 IMPLANT
GOWN STRL REUS W/ TWL LRG LVL3 (GOWN DISPOSABLE) ×8 IMPLANT
GOWN STRL REUS W/ TWL XL LVL3 (GOWN DISPOSABLE) ×4 IMPLANT
GOWN STRL REUS W/TWL LRG LVL3 (GOWN DISPOSABLE) ×12
GOWN STRL REUS W/TWL XL LVL3 (GOWN DISPOSABLE) ×6
GRASPER SUT TROCAR 14GX15 (MISCELLANEOUS) ×3 IMPLANT
IRRIGATION STRYKERFLOW (MISCELLANEOUS) ×2 IMPLANT
IRRIGATOR STRYKERFLOW (MISCELLANEOUS) ×3
IV LACTATED RINGERS 1000ML (IV SOLUTION) ×6 IMPLANT
KIT PINK PAD W/HEAD ARE REST (MISCELLANEOUS) ×3
KIT PINK PAD W/HEAD ARM REST (MISCELLANEOUS) ×2 IMPLANT
KIT TURNOVER CYSTO (KITS) ×3 IMPLANT
LABEL OR SOLS (LABEL) ×3 IMPLANT
MANIPULATOR VCARE LG CRV RETR (MISCELLANEOUS) IMPLANT
MANIPULATOR VCARE SML CRV RETR (MISCELLANEOUS) ×1 IMPLANT
MANIPULATOR VCARE STD CRV RETR (MISCELLANEOUS) IMPLANT
NEEDLE VERESS 14GA 120MM (NEEDLE) ×3 IMPLANT
NS IRRIG 500ML POUR BTL (IV SOLUTION) ×3 IMPLANT
OCCLUDER COLPOPNEUMO (BALLOONS) ×3 IMPLANT
PACK GYN LAPAROSCOPIC (MISCELLANEOUS) ×3 IMPLANT
PAD OB MATERNITY 4.3X12.25 (PERSONAL CARE ITEMS) ×3 IMPLANT
PAD PREP 24X41 OB/GYN DISP (PERSONAL CARE ITEMS) ×3 IMPLANT
PORT ACCESS TROCAR AIRSEAL 12 (TROCAR) ×2 IMPLANT
PORT ACCESS TROCAR AIRSEAL 5M (TROCAR)
SCISSORS METZENBAUM CVD 33 (INSTRUMENTS) ×1 IMPLANT
SET CYSTO W/LG BORE CLAMP LF (SET/KITS/TRAYS/PACK) ×3 IMPLANT
SET TRI-LUMEN FLTR TB AIRSEAL (TUBING) ×3 IMPLANT
SHEARS HARMONIC ACE PLUS 36CM (ENDOMECHANICALS) ×3 IMPLANT
SLEEVE ENDOPATH XCEL 5M (ENDOMECHANICALS) ×3 IMPLANT
SPONGE GAUZE 2X2 8PLY STRL LF (GAUZE/BANDAGES/DRESSINGS) ×6 IMPLANT
SUT ENDO VLOC 180-0-8IN (SUTURE) IMPLANT
SUT VIC AB 0 CT1 36 (SUTURE) ×3 IMPLANT
SUT VIC AB 4-0 FS2 27 (SUTURE) ×3 IMPLANT
SUT VICRYL+ 3-0 36IN CT-1 (SUTURE) ×1 IMPLANT
SYR 10ML LL (SYRINGE) ×3 IMPLANT
SYR 50ML LL SCALE MARK (SYRINGE) ×3 IMPLANT
TROCAR PORT AIRSEAL 8X100 (TROCAR) ×3 IMPLANT
TROCAR XCEL NON-BLD 5MMX100MML (ENDOMECHANICALS) ×3 IMPLANT

## 2020-04-06 NOTE — Transfer of Care (Signed)
Immediate Anesthesia Transfer of Care Note  Patient: Tiffany Frey  Procedure(s) Performed: TOTAL LAPAROSCOPIC HYSTERECTOMY WITH SALPINGECTOMY (Bilateral ) CYSTOSCOPY  Patient Location: PACU  Anesthesia Type:General  Level of Consciousness: awake  Airway & Oxygen Therapy: Patient Spontanous Breathing and Patient connected to face mask oxygen  Post-op Assessment: Report given to RN  Post vital signs: stable  Last Vitals:  Vitals Value Taken Time  BP    Temp    Pulse    Resp    SpO2      Last Pain:  Vitals:   04/06/20 0738  TempSrc: Temporal  PainSc: 0-No pain         Complications: No complications documented.

## 2020-04-06 NOTE — Anesthesia Preprocedure Evaluation (Signed)
Anesthesia Evaluation  Patient identified by MRN, date of birth, ID band Patient awake    Reviewed: Allergy & Precautions, H&P , NPO status , Patient's Chart, lab work & pertinent test results, reviewed documented beta blocker date and time   History of Anesthesia Complications Negative for: history of anesthetic complications  Airway Mallampati: I  TM Distance: >3 FB Neck ROM: full    Dental  (+) Dental Advidsory Given, Caps   Pulmonary neg shortness of breath, asthma (as a child) , neg COPD, neg recent URI,    Pulmonary exam normal breath sounds clear to auscultation       Cardiovascular Exercise Tolerance: Good negative cardio ROS Normal cardiovascular exam Rhythm:regular Rate:Normal     Neuro/Psych PSYCHIATRIC DISORDERS Anxiety negative neurological ROS     GI/Hepatic negative GI ROS, Neg liver ROS,   Endo/Other  neg diabetesHypothyroidism   Renal/GU negative Renal ROS  negative genitourinary   Musculoskeletal   Abdominal   Peds  Hematology negative hematology ROS (+)   Anesthesia Other Findings Past Medical History: No date: Anxiety No date: Fibroids 11/18/2018: Hx of dysplastic nevus     Comment:  L low back paraspinal above sacral area No date: Hypothyroidism No date: Spontaneous abortion No date: Thyroid disease   Reproductive/Obstetrics negative OB ROS                             Anesthesia Physical Anesthesia Plan  ASA: II  Anesthesia Plan: General   Post-op Pain Management:    Induction: Intravenous  PONV Risk Score and Plan: 3 and Ondansetron, Dexamethasone, Midazolam, Promethazine and Treatment may vary due to age or medical condition  Airway Management Planned: Oral ETT  Additional Equipment:   Intra-op Plan:   Post-operative Plan: Extubation in OR  Informed Consent: I have reviewed the patients History and Physical, chart, labs and discussed the  procedure including the risks, benefits and alternatives for the proposed anesthesia with the patient or authorized representative who has indicated his/her understanding and acceptance.     Dental Advisory Given  Plan Discussed with: Anesthesiologist, CRNA and Surgeon  Anesthesia Plan Comments:         Anesthesia Quick Evaluation

## 2020-04-06 NOTE — Discharge Instructions (Signed)
AMBULATORY SURGERY  DISCHARGE INSTRUCTIONS   1) The drugs that you were given will stay in your system until tomorrow so for the next 24 hours you should not:  A) Drive an automobile B) Make any legal decisions C) Drink any alcoholic beverage   2) You may resume regular meals tomorrow.  Today it is better to start with liquids and gradually work up to solid foods.  You may eat anything you prefer, but it is better to start with liquids, then soup and crackers, and gradually work up to solid foods.   3) Please notify your doctor immediately if you have any unusual bleeding, trouble breathing, redness and pain at the surgery site, drainage, fever, or pain not relieved by medication.    4) Additional Instructions:        Please contact your physician with any problems or Same Day Surgery at [phone], Monday through Friday 6 am to 4 pm, or [place] at [facility] number at [phone].Total Laparoscopic Hysterectomy, Care After This sheet gives you information about how to care for yourself after your procedure. Your health care provider may also give you more specific instructions. If you have problems or questions, contact your health care provider. What can I expect after the procedure? After the procedure, it is common to have:  Pain and bruising around your incisions.  A sore throat, if a breathing tube was used during surgery.  Fatigue.  Poor appetite.  Less interest in sex. If your ovaries were also removed, it is also common to have symptoms of menopause such as hot flashes, night sweats, and lack of sleep (insomnia). Follow these instructions at home: Bathing  Do not take baths, swim, or use a hot tub until your health care provider approves. You may need to only take showers for 2-3 weeks.  Keep your bandage (dressing) dry until your health care provider says it can be removed. Incision care   Follow instructions from your health care provider about  how to take care of your incisions. Make sure you: ? Wash your hands with soap and water before you change your dressing. If soap and water are not available, use hand sanitizer. ? Change your dressing as told by your health care provider. ? Leave stitches (sutures), skin glue, or adhesive strips in place. These skin closures may need to stay in place for 2 weeks or longer. If adhesive strip edges start to loosen and curl up, you may trim the loose edges. Do not remove adhesive strips completely unless your health care provider tells you to do that.  Check your incision area every day for signs of infection. Check for: ? Redness, swelling, or pain. ? Fluid or blood. ? Warmth. ? Pus or a bad smell. Activity  Get plenty of rest and sleep.  Do not lift anything that is heavier than 10 lbs (4.5 kg) for one month after surgery, or as long as told by your health care provider.  Do not drive or use heavy machinery while taking prescription pain medicine.  Do not drive for 24 hours if you were given a medicine to help you relax (sedative).  Return to your normal activities as told by your health care provider. Ask your health care provider what activities are safe for you. Lifestyle   Do not use any products that contain nicotine or tobacco, such as cigarettes and e-cigarettes. These can delay healing. If you need help quitting, ask your health care provider.  Do not drink  alcohol until your health care provider approves. General instructions  Do not douche, use tampons, or have sex for at least 6 weeks, or as told by your health care provider.  Take over-the-counter and prescription medicines only as told by your health care provider.  To monitor yourself for a fever, take your temperature at least once a day during recovery.  If you struggle with physical or emotional changes after your procedure, speak with your health care provider or a therapist.  To prevent or treat constipation  while you are taking prescription pain medicine, your health care provider may recommend that you: ? Drink enough fluid to keep your urine clear or pale yellow. ? Take over-the-counter or prescription medicines. ? Eat foods that are high in fiber, such as fresh fruits and vegetables, whole grains, and beans. ? Limit foods that are high in fat and processed sugars, such as fried and sweet foods.  Keep all follow-up visits as told by your health care provider. This is important. Contact a health care provider if:  You have chills or a fever.  You have redness, swelling, or pain around an incision.  You have fluid or blood coming from an incision.  Your incision feels warm to the touch.  You have pus or a bad smell coming from an incision.  An incision breaks open.  You feel dizzy or light-headed.  You have pain or bleeding when you urinate.  You have diarrhea, nausea, or vomiting that does not go away.  You have abnormal vaginal discharge.  You have a rash.  You have pain that does not get better with medicine. Get help right away if:  You have a fever and your symptoms suddenly get worse.  You have severe abdominal pain.  You have chest pain.  You have shortness of breath.  You faint.  You have pain, swelling, or redness on your leg.  You have heavy vaginal bleeding with blood clots. Summary  After the procedure it is common to have abdominal pain. Your provider will give you medication for this.  Do not take baths, swim, or use a hot tub until your health care provider approves.  Do not lift anything that is heavier than 10 lbs (4.5 kg) for one month after surgery, or as long as told by your health care provider.  Notify your provider if you have any signs or symptoms of infection after the procedure. This information is not intended to replace advice given to you by your health care provider. Make sure you discuss any questions you have with your health care  provider. Document Revised: 06/06/2017 Document Reviewed: 09/04/2016 Elsevier Patient Education  2020 [employer].

## 2020-04-06 NOTE — Anesthesia Procedure Notes (Signed)
Procedure Name: Intubation Performed by: Fletcher-Harrison, Anginette Espejo, CRNA Pre-anesthesia Checklist: Patient identified, Emergency Drugs available, Suction available and Patient being monitored Patient Re-evaluated:Patient Re-evaluated prior to induction Oxygen Delivery Method: Circle system utilized Preoxygenation: Pre-oxygenation with 100% oxygen Induction Type: IV induction Ventilation: Mask ventilation without difficulty Laryngoscope Size: McGraph and 3 Grade View: Grade I Tube type: Oral Tube size: 6.5 mm Number of attempts: 1 Airway Equipment and Method: Stylet and Oral airway Placement Confirmation: ETT inserted through vocal cords under direct vision,  positive ETCO2,  breath sounds checked- equal and bilateral and CO2 detector Secured at: 21 cm Tube secured with: Tape Dental Injury: Teeth and Oropharynx as per pre-operative assessment        

## 2020-04-06 NOTE — Interval H&P Note (Signed)
History and Physical Interval Note:  04/06/2020 7:40 AM  Tiffany Frey  has presented today for surgery, with the diagnosis of Pelvic pain R10.2 Fibroid D21.9.  The various methods of treatment have been discussed with the patient and family. After consideration of risks, benefits and other options for treatment, the patient has consented to  Procedure(s): TOTAL LAPAROSCOPIC HYSTERECTOMY WITH SALPINGECTOMY (Bilateral) as a surgical intervention.  The patient's history has been reviewed, patient examined, no change in status, stable for surgery.  I have reviewed the patient's chart and labs.  Questions were answered to the patient's satisfaction.     Hoyt Koch

## 2020-04-06 NOTE — Op Note (Signed)
Operative Report:  PRE-OP DIAGNOSIS: Pelvic pain R10.2 Fibroid D21.9   POST-OP DIAGNOSIS: Pelvic pain R10.2 Fibroid D21.9   PROCEDURE: Procedure(s): TOTAL LAPAROSCOPIC HYSTERECTOMY WITH SALPINGECTOMY CYSTOSCOPY  SURGEON: Barnett Applebaum, MD, FACOG  ASSISTANT: Dr Gilman Schmidt, No other capable assistant available, in surgery requiring high level assistant.  ANESTHESIA: General endotracheal anesthesia  ESTIMATED BLOOD LOSS: less than 50   SPECIMENS: Uterus, Tubes.  COMPLICATIONS: None  DISPOSITION: stable to PACU  FINDINGS: Intraabdominal adhesions were not noted. Fibroids, multiple on uterus including lower uterine segment near cervix  PROCEDURE:  The patient was taken to the OR where anesthesia was administed. She was prepped and draped in the normal sterile fashion in the dorsal lithotomy position in the [place] stirrups. A time out was performed. A Graves speculum was inserted, the cervix was grasped with a single tooth tenaculum and the endometrial cavity was sounded. The cervix was progressively dilated to a size 18 [place] with [employer] dilators. A V-Care uterine manipulator was inserted in the usual fashion without incident. Gloves were changed and attention was turned to the abdomen.   An infraumbilical transverse 38mm skin incision was made with the scalpel after local anesthesia applied to the skin. A Veress-step needle was inserted in the usual fashion and confirmed using the hanging drop technique. A pneumoperitoneum was obtained by insufflation of CO2 (opening pressure of 51mmHg) to 80mmHg. An 8 mm trocar is then placed under direct visualization with the laparoscope.   A diagnostic laparoscopy was performed yielding the previously described findings. Attention was turned to the left lower quadrant where after visualization of the inferior epigastric vessels a 36mm skin incision was made with the scalpel. A 5 mm laparoscopic port was inserted. The same procedure was repeated in the right  lower quadrant with a 65mm trocar. Attention was turned to the left aspect of the uterus, where after visualization of the ureter, the round ligament was coagulated and transected using the 37mm Harmonic Scapel. The anterior and posterior leafs of the broad ligament were dissected off as the anterior one was coagulated and transected in a caudal direction towards the cuff of the uterine manipulator.  Attention was then turned to the left fallopian tube which was recognized by visualization of the fimbria. The tube is excised to its attachment to the uterus. The uterine-ovarian ligament and its blood vessels were carefully coagulated and transected using the Harmonic scapel.  Attention was turned to the right aspect of the uterus where the same procedure was performed.  The vesicouterine reflection of the peritoneum was dissected with the harmonic scapel and the bladder flap was created bluntly.  The uterine vessels were coagulated and transected bilaterally using first bipolar cautery and then the harmonic scapel. A 360 degree, circumferential colpotomy was done to completely amputate the uterus with cervix and tubes. Once the specimen was amputated it was delivered through the vagina.   The colpotomy was repaired in a simple running fashion using a delayed absorbable suture with an endo-stitch device.  Vaginal exam confirms complete closure.  The cavity was copiously irrigated. A survey of the pelvic cavity revealed adequate hemostasis and no injury to bowel, bladder, or ureter.  The assistance of my assisting-physician was vital to resect and retract interchangably with self on each side.   A diagnostic cystoscopy was performed using saline distension of bladder with no lesions or injuries noted.  Bilateral urine flow from each ureteral orifice is visualized.  At this point the procedure was finalized. Right lower quadrant fascia incision  is closed with a vicryl suture using the fascia closure device. All the  instruments were removed from the patient's body. Gas was expelled and patient is leveled.  Incisions are closed with skin adhesive.    Patient goes to recovery room in stable condition.  All sponge, instrument, and needle counts are correct x2.     Barnett Applebaum, MD, [facility], [facility] 04/06/2020  11:31 AM

## 2020-04-07 LAB — SURGICAL PATHOLOGY

## 2020-04-10 NOTE — Anesthesia Postprocedure Evaluation (Signed)
Anesthesia Post Note  Patient: Tiffany Frey  Procedure(s) Performed: TOTAL LAPAROSCOPIC HYSTERECTOMY WITH SALPINGECTOMY (Bilateral ) CYSTOSCOPY  Patient location during evaluation: PACU Anesthesia Type: General Level of consciousness: awake and alert Pain management: pain level controlled Vital Signs Assessment: post-procedure vital signs reviewed and stable Respiratory status: spontaneous breathing, nonlabored ventilation, respiratory function stable and patient connected to nasal cannula oxygen Cardiovascular status: blood pressure returned to baseline and stable Postop Assessment: no apparent nausea or vomiting Anesthetic complications: no   No complications documented.   Last Vitals:  Vitals:   04/06/20 1217 04/06/20 1229  BP: 113/64 119/74  Pulse: 68 66  Resp: 18 20  Temp: 36.6 C 36.7 C  SpO2: 100% 97%    Last Pain:  Vitals:   04/07/20 0931  TempSrc:   PainSc: 0-No pain                 Martha Clan

## 2020-04-12 ENCOUNTER — Encounter: Payer: Self-pay | Admitting: Obstetrics & Gynecology

## 2020-04-12 ENCOUNTER — Ambulatory Visit (INDEPENDENT_AMBULATORY_CARE_PROVIDER_SITE_OTHER): Payer: 59 | Admitting: Obstetrics & Gynecology

## 2020-04-12 ENCOUNTER — Other Ambulatory Visit: Payer: Self-pay

## 2020-04-12 VITALS — BP 120/80 | Ht 66.0 in | Wt 140.0 lb

## 2020-04-12 DIAGNOSIS — E039 Hypothyroidism, unspecified: Secondary | ICD-10-CM

## 2020-04-12 DIAGNOSIS — Z90710 Acquired absence of both cervix and uterus: Secondary | ICD-10-CM

## 2020-04-12 MED ORDER — LEVOTHYROXINE SODIUM 50 MCG PO TABS: ORAL_TABLET | ORAL | 1 refills | Status: DC

## 2020-04-12 NOTE — Progress Notes (Signed)
  Postoperative Follow-up Patient presents post op from [facility] for fibroids, 1 week ago. Images:   Path: DIAGNOSIS: Wt 177g A. UTERUS WITH CERVIX AND BILATERAL FALLOPIAN TUBES; TOTAL HYSTERECTOMY  WITH BILATERAL SALPINGECTOMY:  - FIBROUS SEROSAL ADHESIONS.  - CERVIX WITH NABOTHIAN CYSTS AND FOCAL SQUAMOUS METAPLASIA.  - WEAKLY PROLIFERATIVE ENDOMETRIUM.  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  - INTRAVASCULAR EMBOLIZATION MATERIAL.  - BILATERAL FALLOPIAN TUBES WITH FIMBRIATED END.  - RIGHT BENIGN PARATUBAL CYST.  - NEGATIVE FOR ATYPIA AND MALIGNANCY.  Subjective: Patient reports some improvement in her preop symptoms. Eating a regular diet without difficulty. The patient is not having any pain.  Activity: normal activities of daily living. Patient reports additional symptom's since surgery of No bleeding  Objective: BP 120/80   Ht 5\' 6"  (1.676 m)   Wt 140 lb (63.5 kg)   BMI 22.60 kg/m  Physical Exam Constitutional:      General: She is not in acute distress.    Appearance: She is well-developed.  Cardiovascular:     Rate and Rhythm: Normal rate.  Pulmonary:     Effort: Pulmonary effort is normal.  Abdominal:     General: There is no distension.     Palpations: Abdomen is soft.     Tenderness: There is no abdominal tenderness.     Comments: Incision Healing Well   Musculoskeletal:        General: Normal range of motion.  Neurological:     Mental Status: She is alert and oriented to person, place, and time.     Cranial Nerves: No cranial nerve deficit.  Skin:    General: Skin is warm and dry.     Assessment: s/p :  total laparoscopic hysterectomy with bilateral salpingectomy stable  Plan: Patient has done well after surgery with no apparent complications.  I have discussed the post-operative course to date, and the expected progress moving forward.  The patient understands what complications to be concerned about.  I will see the  patient in routine follow up, or sooner if needed.    Activity plan: No heavy lifting.  Pelvic rest.  Hoyt Koch 04/12/2020, 11:42 AM

## 2020-05-10 ENCOUNTER — Ambulatory Visit (INDEPENDENT_AMBULATORY_CARE_PROVIDER_SITE_OTHER): Payer: 59 | Admitting: Obstetrics & Gynecology

## 2020-05-10 ENCOUNTER — Other Ambulatory Visit: Payer: Self-pay

## 2020-05-10 ENCOUNTER — Encounter: Payer: Self-pay | Admitting: Obstetrics & Gynecology

## 2020-05-10 VITALS — BP 100/60 | Ht 66.0 in | Wt 140.0 lb

## 2020-05-10 DIAGNOSIS — Z90710 Acquired absence of both cervix and uterus: Secondary | ICD-10-CM

## 2020-05-10 DIAGNOSIS — Z1231 Encounter for screening mammogram for malignant neoplasm of breast: Secondary | ICD-10-CM

## 2020-05-10 NOTE — Progress Notes (Signed)
  Postoperative Follow-up Patient presents post op from [facility] for pelvic pain, 5 weeks ago.  Subjective: Patient reports marked improvement in her preop symptoms. Eating a regular diet without difficulty. The patient is not having any pain.  Activity: normal activities of daily living. Patient reports additional symptom's since surgery of None.  Objective: BP 100/60   Ht 5\' 6"  (1.676 m)   Wt 140 lb (63.5 kg)   BMI 22.60 kg/m  Physical Exam Constitutional:      General: She is not in acute distress.    Appearance: She is well-developed.  Genitourinary:     Pelvic exam was performed with patient supine.     Vagina and rectum normal.     No vaginal erythema or bleeding.     No right or left adnexal mass present.     Right adnexa not tender.     Left adnexa not tender.     Genitourinary Comments: Cervix and uterus absent. Vaginal cuff healing well, 97% healed at stitch line  Cardiovascular:     Rate and Rhythm: Normal rate.  Pulmonary:     Effort: Pulmonary effort is normal.  Abdominal:     General: There is no distension.     Palpations: Abdomen is soft.     Tenderness: There is no abdominal tenderness.     Comments: Incision healing well.  Musculoskeletal:        General: Normal range of motion.  Neurological:     Mental Status: She is alert and oriented to person, place, and time.     Cranial Nerves: No cranial nerve deficit.  Skin:    General: Skin is warm and dry.     Assessment: s/p :  total laparoscopic hysterectomy with bilateral salpingectomy progressing well  Plan: Patient has done well after surgery with no apparent complications.  I have discussed the post-operative course to date, and the expected progress moving forward.  The patient understands what complications to be concerned about.  I will see the patient in routine follow up, or sooner if needed.    Activity plan: No restriction. Pelvic rest 2 more weeks.  Hoyt Koch 05/10/2020, 3:24  PM

## 2020-06-08 ENCOUNTER — Other Ambulatory Visit: Payer: Self-pay | Admitting: Obstetrics & Gynecology

## 2020-06-08 DIAGNOSIS — R102 Pelvic and perineal pain: Secondary | ICD-10-CM

## 2020-06-08 NOTE — Telephone Encounter (Signed)
MRI ordered Sch appintment 2 weeks w me

## 2020-06-08 NOTE — Telephone Encounter (Signed)
Patient is scheduled for office visit on 06/28/20 with [facility]

## 2020-06-20 ENCOUNTER — Ambulatory Visit
Admission: RE | Admit: 2020-06-20 | Discharge: 2020-06-20 | Disposition: A | Discharge status: Home / Self Care | Payer: 59 | Source: Ambulatory Visit | Attending: Obstetrics & Gynecology | Admitting: Obstetrics & Gynecology

## 2020-06-20 ENCOUNTER — Other Ambulatory Visit: Payer: Self-pay

## 2020-06-20 DIAGNOSIS — R102 Pelvic and perineal pain: Secondary | ICD-10-CM | POA: Insufficient documentation

## 2020-06-20 MED ORDER — GADOBUTROL 1 MMOL/ML IV SOLN
6.0000 mL | Freq: Once | INTRAVENOUS | Status: AC | PRN
  Administered 2020-06-20: 6 mL via INTRAVENOUS

## 2020-06-28 ENCOUNTER — Ambulatory Visit: Payer: 59 | Admitting: Obstetrics & Gynecology

## 2020-07-27 IMAGING — MG DIGITAL SCREENING BILAT W/ TOMO W/ CAD
8 series · 8 of 24 positions shown · non-contrast
Comparison: Previous exam(s).

CLINICAL DATA: Screening.

EXAM:
DIGITAL SCREENING BILATERAL MAMMOGRAM WITH TOMO AND CAD

[L CC synth-2D]
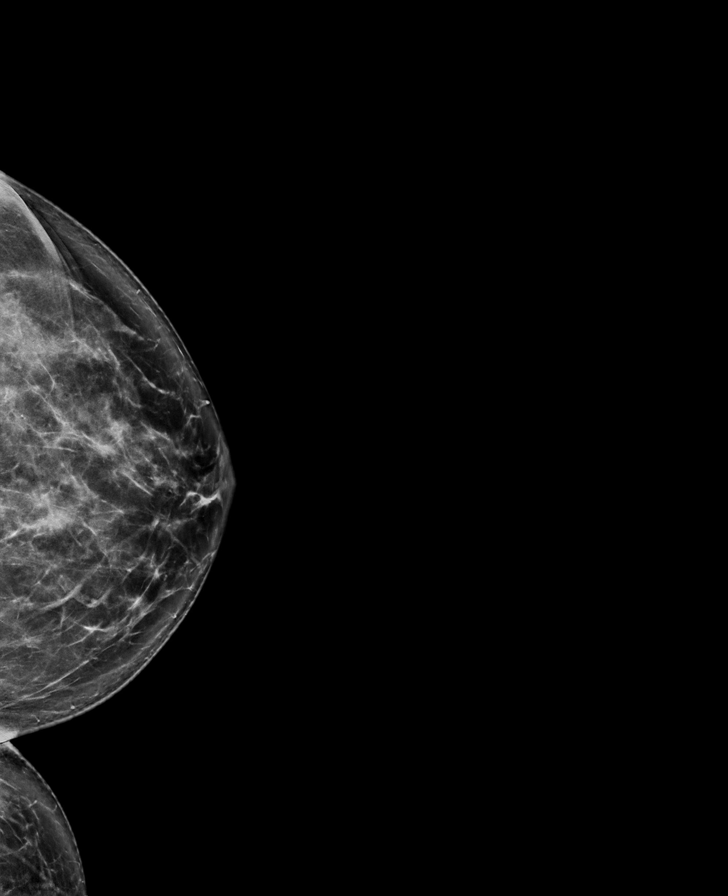

[L MLO synth-2D]
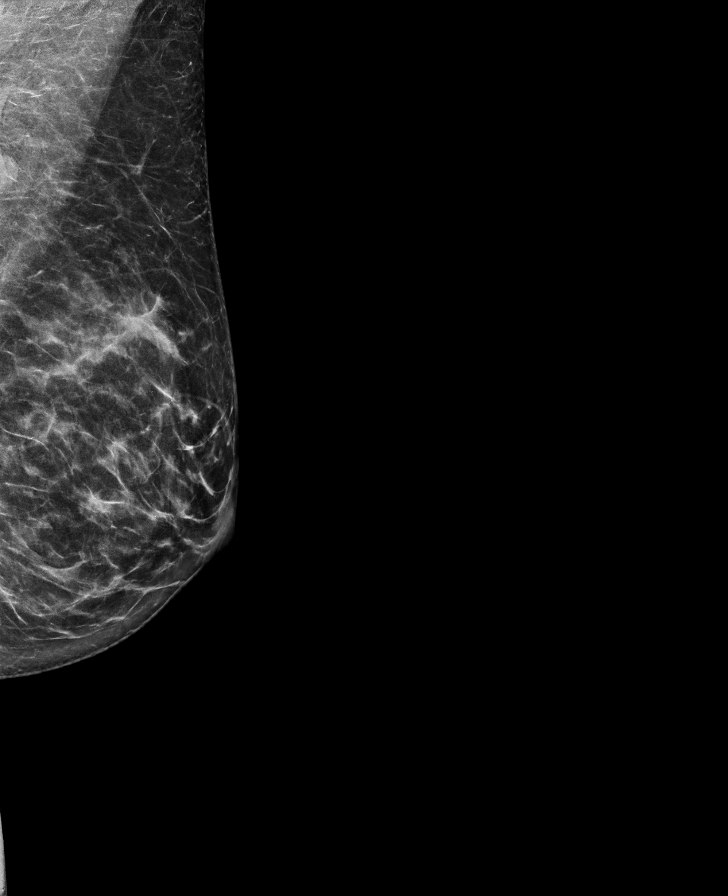

[R MLO synth-2D]
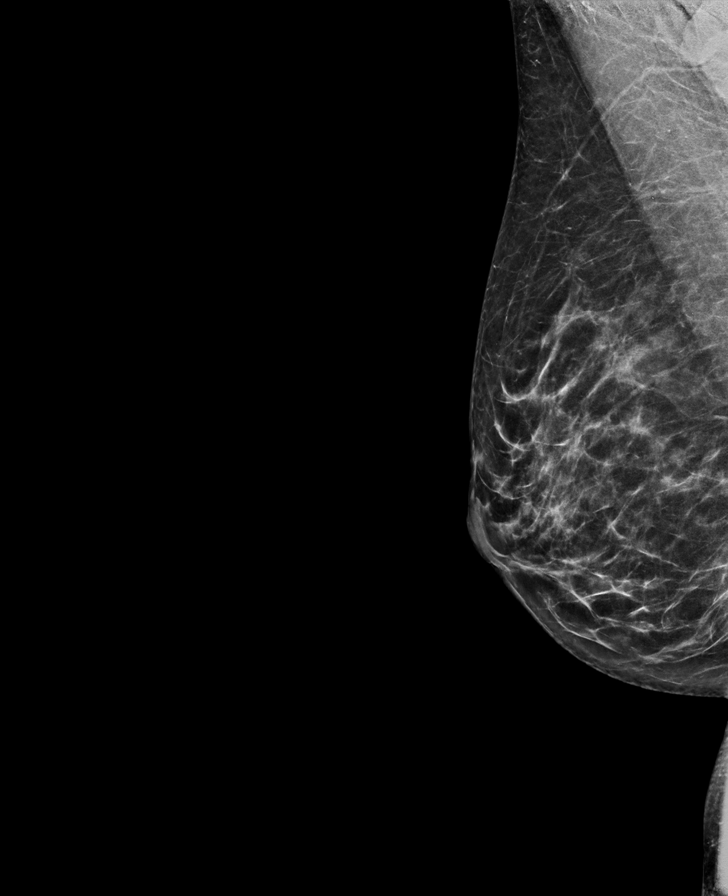

[R CC synth-2D]
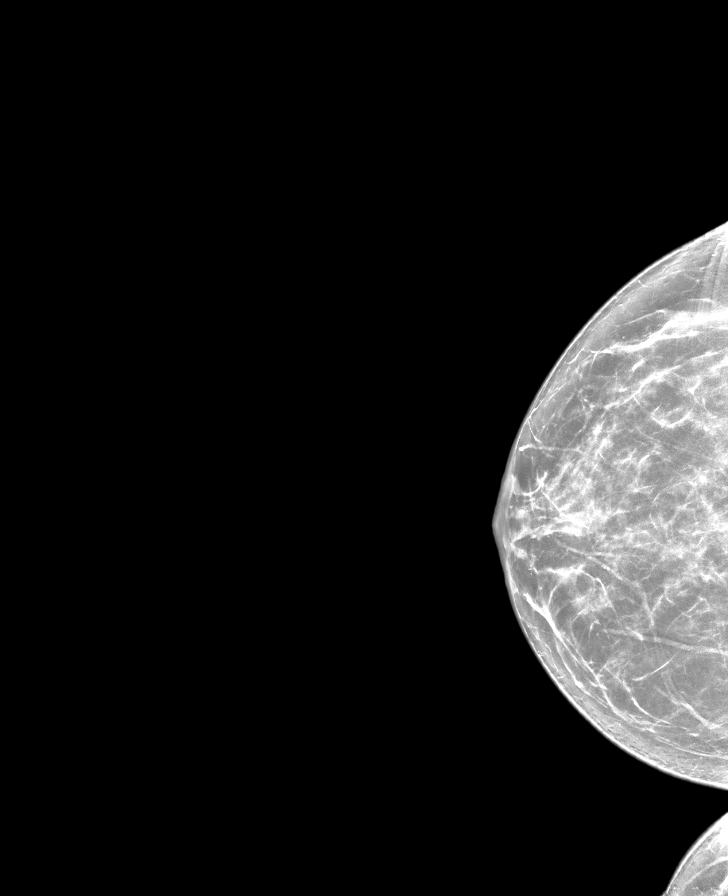

[R CC tomo · tomo slice 35/68.0]
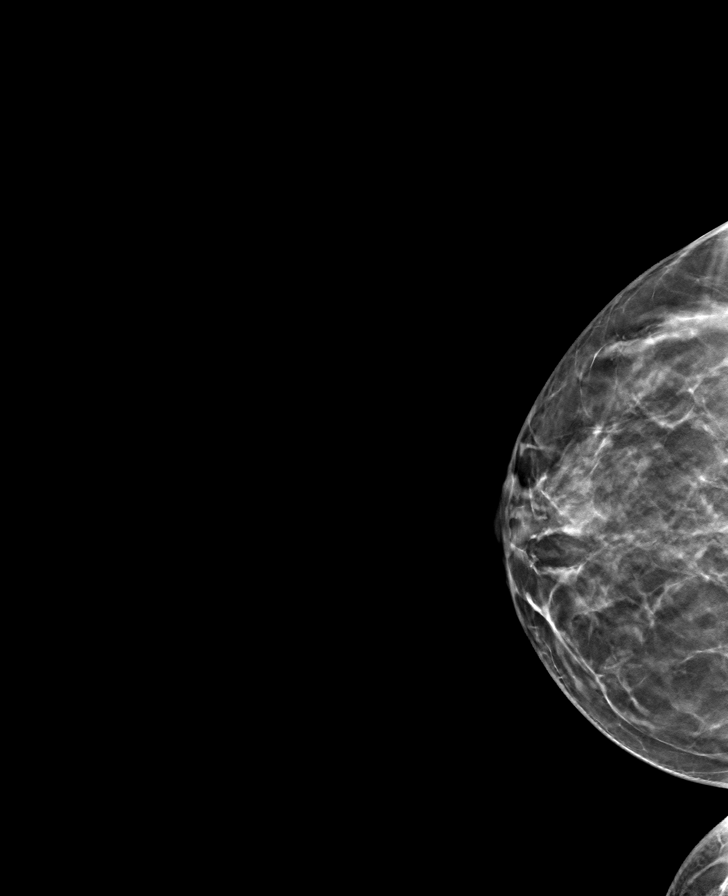

[R MLO tomo · tomo slice 33/66.0]
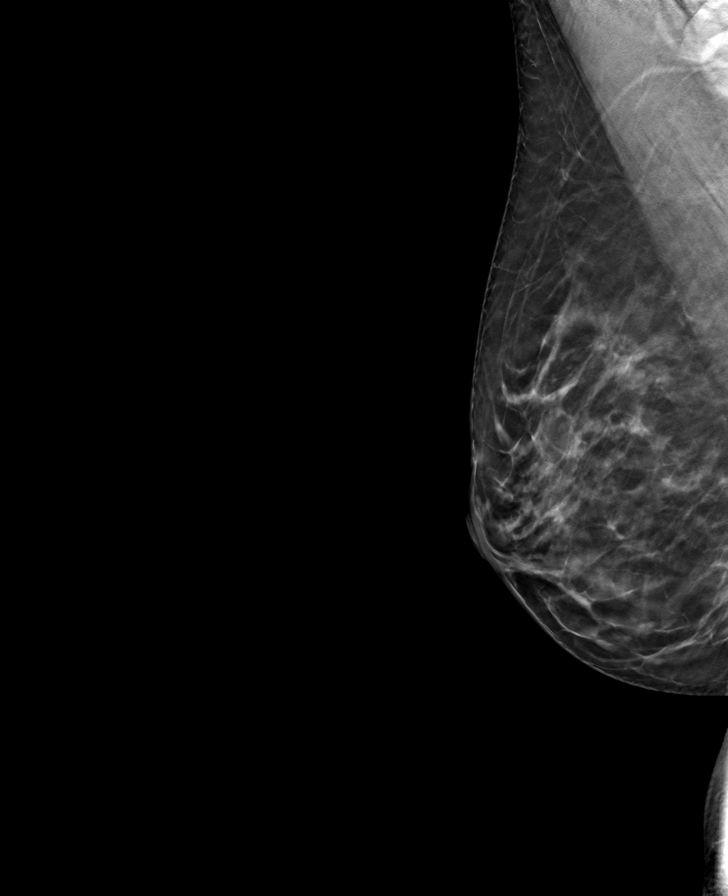

[L MLO tomo · tomo slice 36/71.0]
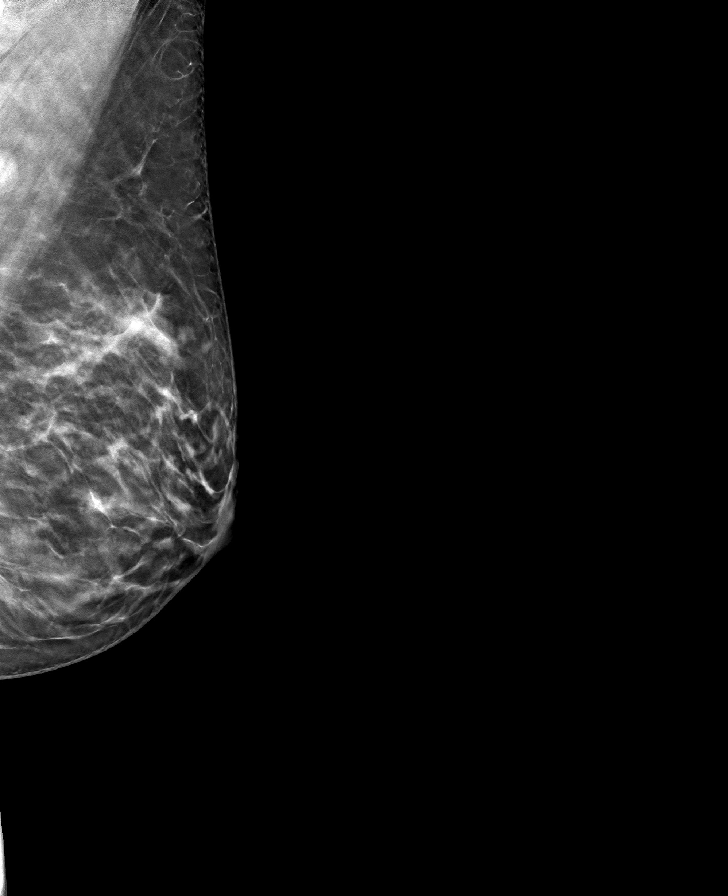

[L CC tomo · tomo slice 39/78.0]
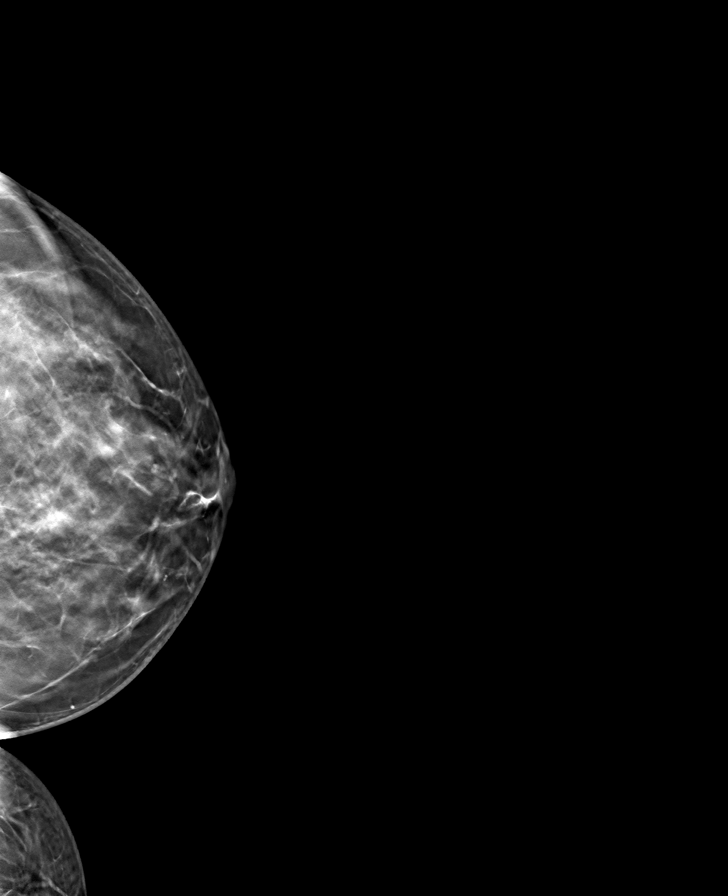

[8 of 24 positions shown; findings below may reference images not displayed]

ACR Breast Density Category b: There are scattered areas of
fibroglandular density.
FINDINGS: There are no findings suspicious for malignancy. Images were
processed with CAD.
IMPRESSION: No mammographic evidence of malignancy. A result letter of this
screening mammogram will be mailed directly to the patient.

RECOMMENDATION:
Screening mammogram in one year. (Code:[id number])

BI-RADS CATEGORY  1: Negative.

## 2020-08-01 ENCOUNTER — Ambulatory Visit: Payer: 59 | Admitting: Dermatology

## 2020-08-09 ENCOUNTER — Encounter: Payer: 59 | Admitting: Hospice and Palliative Medicine

## 2020-08-09 NOTE — Telephone Encounter (Signed)
Mirena rcvd/charged 11/12/2018

## 2020-08-29 ENCOUNTER — Other Ambulatory Visit: Payer: Self-pay

## 2020-08-29 ENCOUNTER — Encounter: Payer: Self-pay | Admitting: Internal Medicine

## 2020-08-29 ENCOUNTER — Ambulatory Visit (INDEPENDENT_AMBULATORY_CARE_PROVIDER_SITE_OTHER): Payer: 59 | Admitting: Internal Medicine

## 2020-08-29 VITALS — BP 110/70 | HR 70 | Temp 97.4°F | Resp 16 | Ht 66.0 in | Wt 143.6 lb

## 2020-08-29 DIAGNOSIS — E039 Hypothyroidism, unspecified: Secondary | ICD-10-CM | POA: Diagnosis not present

## 2020-08-29 DIAGNOSIS — Z79899 Other long term (current) drug therapy: Secondary | ICD-10-CM

## 2020-08-29 DIAGNOSIS — Z1231 Encounter for screening mammogram for malignant neoplasm of breast: Secondary | ICD-10-CM

## 2020-08-29 DIAGNOSIS — Z0001 Encounter for general adult medical examination with abnormal findings: Secondary | ICD-10-CM | POA: Diagnosis not present

## 2020-08-29 DIAGNOSIS — F411 Generalized anxiety disorder: Secondary | ICD-10-CM | POA: Diagnosis not present

## 2020-08-29 DIAGNOSIS — R300 Dysuria: Secondary | ICD-10-CM

## 2020-08-29 DIAGNOSIS — F419 Anxiety disorder, unspecified: Secondary | ICD-10-CM

## 2020-08-29 LAB — POCT URINE DRUG SCREEN
Methylenedioxyamphetamine: NOT DETECTED
POC Amphetamine UR: NOT DETECTED
POC BENZODIAZEPINES UR: NOT DETECTED
POC Barbiturate UR: NOT DETECTED
POC Cocaine UR: NOT DETECTED
POC Ecstasy UR: NOT DETECTED
POC Marijuana UR: NOT DETECTED
POC Methadone UR: NOT DETECTED
POC Methamphetamine UR: NOT DETECTED
POC Opiate Ur: NOT DETECTED
POC Oxycodone UR: NOT DETECTED
POC PHENCYCLIDINE UR: NOT DETECTED
POC TRICYCLICS UR: NOT DETECTED

## 2020-08-29 MED ORDER — ALPRAZOLAM 0.25 MG PO TABS: 0.2500 mg | ORAL_TABLET | Freq: Two times a day (BID) | ORAL | 0 refills | Status: DC | PRN

## 2020-08-29 NOTE — Progress Notes (Signed)
[facility] [place], [place] [address]  Internal MEDICINE  Office Visit Note  Patient Name: Tiffany Frey  [id number]  [id number]  Date of Service: 08/29/2020  Chief Complaint  Patient presents with  . Annual Exam    Refill request  . Anxiety  . Quality Metric Gaps    Colonoscopy      HPI Pt is here for routine health maintenance examination, feels well, recent hysterectomy 2021 due to fibroids, went well. Takes all medicines as prescribed. Takes xanax for travel only. No c/p or sob. Will be due to for colon screening on next visit. CPE and pap done by obgyn along with mammogram   Current Medication: Outpatient Encounter Medications as of 08/29/2020  Medication Sig  . Efinaconazole (JUBLIA) 10 % SOLN Apply 1 application topically at bedtime.  Marland Kitchen escitalopram (LEXAPRO) 10 MG tablet Take 0.5 tablets (5 mg total) by mouth daily.  Marland Kitchen levothyroxine (SYNTHROID) 50 MCG tablet TAKE ONE TABLET BY MOUTH IN THE MORNING 30  MINUTES  BEFORE  BREAKFAST  . [DISCONTINUED] ALPRAZolam (XANAX) 0.25 MG tablet Take 1 tablet (0.25 mg total) by mouth 2 (two) times daily as needed for anxiety.  . ALPRAZolam (XANAX) 0.25 MG tablet Take 1 tablet (0.25 mg total) by mouth 2 (two) times daily as needed for anxiety.   No facility-administered encounter medications on file as of 08/29/2020.    Surgical History: Past Surgical History:  Procedure Laterality Date  . CYSTOSCOPY  04/06/2020   Procedure: CYSTOSCOPY;  Surgeon: Gae Dry, MD;  Location: [facility] ORS;  Service: Gynecology;;  . LAPAROSCOPY    . MYOMECTOMY ABDOMINAL APPROACH    . TOTAL LAPAROSCOPIC HYSTERECTOMY WITH SALPINGECTOMY Bilateral 04/06/2020   Procedure: TOTAL LAPAROSCOPIC HYSTERECTOMY WITH SALPINGECTOMY;  Surgeon: Gae Dry, MD;  Location: [facility] ORS;  Service: Gynecology;  Laterality: Bilateral;  . UTERINE FIBROID EMBOLIZATION      Medical History: Past Medical History:  Diagnosis Date  . Anxiety    . Fibroids   . Hx of dysplastic nevus 11/18/2018   L low back paraspinal above sacral area  . Hypothyroidism   . Spontaneous abortion   . Thyroid disease     Family History: Family History  Problem Relation Age of Onset  . COPD Father   . Healthy Mother   . Heart attack Paternal Grandfather 58  . Breast cancer Neg Hx     Social History: Social History   Socioeconomic History  . Marital status: Married    Spouse name: Not on file  . Number of children: 0  . Years of education: Not on file  . Highest education level: Bachelor's degree (e.g., BA, AB, BS)  Occupational History  . Not on file  Tobacco Use  . Smoking status: Never Smoker  . Smokeless tobacco: Never Used  Vaping Use  . Vaping Use: Never used  Substance and Sexual Activity  . Alcohol use: Yes    Alcohol/week: 1.0 - 2.0 standard drink    Types: 1 - 2 Cans of beer per week    Comment: 1-2 x per month  . Drug use: No  . Sexual activity: Not Currently  Other Topics Concern  . Not on file  Social History Narrative  . Not on file   Social Determinants of Health   Financial Resource Strain: Not on file  Food Insecurity: Not on file  Transportation Needs: Not on file  Physical Activity: Not on file  Stress: Not on file  Social  Connections: Not on file      Review of Systems  Constitutional: Negative for chills, diaphoresis and fatigue.  HENT: Negative for ear pain, postnasal drip and sinus pressure.   Eyes: Negative for photophobia, discharge, redness, itching and visual disturbance.  Respiratory: Negative for cough, shortness of breath and wheezing.   Cardiovascular: Negative for chest pain, palpitations and leg swelling.  Gastrointestinal: Negative for abdominal pain, constipation, diarrhea, nausea and vomiting.  Genitourinary: Negative for dysuria and flank pain.  Musculoskeletal: Negative for arthralgias, back pain, gait problem and neck pain.  Skin: Negative for color change.   Allergic/Immunologic: Negative for environmental allergies and food allergies.  Neurological: Negative for dizziness and headaches.  Hematological: Does not bruise/bleed easily.  Psychiatric/Behavioral: Negative for agitation, behavioral problems (depression) and hallucinations.     Vital Signs: BP 110/70 Comment: 146/100  Pulse 70   Temp (!) 97.4 F (36.3 C)   Resp 16   Ht 5\' 6"  (1.676 m)   Wt 143 lb 9.6 oz (65.1 kg)   SpO2 99%   BMI 23.18 kg/m    Physical Exam Constitutional:      General: She is not in acute distress.    Appearance: She is well-developed. She is not diaphoretic.  HENT:     Head: Normocephalic and atraumatic.     Mouth/Throat:     Pharynx: No oropharyngeal exudate.  Eyes:     Pupils: Pupils are equal, round, and reactive to light.  Neck:     Thyroid: No thyromegaly.     Vascular: No JVD.     Trachea: No tracheal deviation.  Cardiovascular:     Rate and Rhythm: Normal rate and regular rhythm.     Heart sounds: Normal heart sounds. No murmur heard. No friction rub. No gallop.   Pulmonary:     Effort: Pulmonary effort is normal. No respiratory distress.     Breath sounds: No wheezing or rales.  Chest:     Chest wall: No tenderness.  Abdominal:     General: Bowel sounds are normal.     Palpations: Abdomen is soft.  Musculoskeletal:        General: Normal range of motion.     Cervical back: Normal range of motion and neck supple.  Lymphadenopathy:     Cervical: No cervical adenopathy.  Skin:    General: Skin is warm and dry.  Neurological:     Mental Status: She is alert and oriented to person, place, and time.     Cranial Nerves: No cranial nerve deficit.  Psychiatric:        Behavior: Behavior normal.        Thought Content: Thought content normal.        Judgment: Judgment normal.       Assessment/Plan: 1. Encounter for general adult medical examination with abnormal findings Age appropriate diagnostics ordered, mammogram is  ordered by Obgyn - CBC with Differential/Platelet - Lipid Panel With LDL/HDL Ratio - TSH - T4, free - Comprehensive metabolic panel  2. Hypothyroidism, unspecified type Continue synthroid as before, will adjust synthroid dose   3. GAD (generalized anxiety disorder) Xanax only for travel prn  - ALPRAZolam (XANAX) 0.25 MG tablet; Take 1 tablet (0.25 mg total) by mouth 2 (two) times daily as needed for anxiety.  Dispense: 20 tablet; Refill: 0  4. Encounter for screening mammogram for malignant neoplasm of breast - MM 3D SCREEN BREAST BILATERAL  5. Encounter for long-term (current) use of medications No need ofr UDS  in future, pt takes xanax very occasionally   6. Dysuria - UA/M w/rflx Culture, Routine   General Counseling: Tacori verbalizes understanding of the findings of todays visit and agrees with plan of treatment. I have discussed any further diagnostic evaluation that may be needed or ordered today. We also reviewed her medications today. she has been encouraged to call the office with any questions or concerns that should arise related to todays visit.    Counseling:  [place] Controlled Substance Database was reviewed by me.  Orders Placed This Encounter  Procedures  . UA/M w/rflx Culture, Routine  . CBC with Differential/Platelet  . Lipid Panel With LDL/HDL Ratio  . TSH  . T4, free  . Comprehensive metabolic panel  . POCT Urine Drug Screen    Meds ordered this encounter  Medications  . ALPRAZolam (XANAX) 0.25 MG tablet    Sig: Take 1 tablet (0.25 mg total) by mouth 2 (two) times daily as needed for anxiety.    Dispense:  20 tablet    Refill:  0    Total time spent:35 Minutes  Time spent includes review of chart, medications, test results, and follow up plan with the patient.     Lavera Guise, MD  Internal Medicine

## 2020-08-30 LAB — CBC WITH DIFFERENTIAL/PLATELET
Basophils Absolute: 0.1 10*3/uL (ref 0.0–0.2)
Basos: 1 %
EOS (ABSOLUTE): 0.3 10*3/uL (ref 0.0–0.4)
Eos: 4 %
Hematocrit: 41.7 % (ref 34.0–46.6)
Hemoglobin: 14.2 g/dL (ref 11.1–15.9)
Immature Grans (Abs): 0 10*3/uL (ref 0.0–0.1)
Immature Granulocytes: 0 %
Lymphocytes Absolute: 1.8 10*3/uL (ref 0.7–3.1)
Lymphs: 25 %
MCH: 31.8 pg (ref 26.6–33.0)
MCHC: 34.1 g/dL (ref 31.5–35.7)
MCV: 93 fL (ref 79–97)
Monocytes Absolute: 0.6 10*3/uL (ref 0.1–0.9)
Monocytes: 9 %
Neutrophils Absolute: 4.4 10*3/uL (ref 1.4–7.0)
Neutrophils: 61 %
Platelets: 278 10*3/uL (ref 150–450)
RBC: 4.47 x10E6/uL (ref 3.77–5.28)
RDW: 12.9 % (ref 11.7–15.4)
WBC: 7.1 10*3/uL (ref 3.4–10.8)

## 2020-08-30 LAB — COMPREHENSIVE METABOLIC PANEL
ALT: 20 IU/L (ref 0–32)
AST: 26 IU/L (ref 0–40)
Albumin/Globulin Ratio: 1.6 (ref 1.2–2.2)
Albumin: 4.6 g/dL (ref 3.8–4.8)
Alkaline Phosphatase: 79 IU/L (ref 44–121)
BUN/Creatinine Ratio: 17 (ref 9–23)
BUN: 16 mg/dL (ref 6–24)
Bilirubin Total: 0.6 mg/dL (ref 0.0–1.2)
CO2: 24 mmol/L (ref 20–29)
Calcium: 9.8 mg/dL (ref 8.7–10.2)
Chloride: 98 mmol/L (ref 96–106)
Creatinine, Ser: 0.94 mg/dL (ref 0.57–1.00)
GFR calc Af Amer: 84 mL/min/{1.73_m2} (ref >59)
GFR calc non Af Amer: 73 mL/min/{1.73_m2} (ref >59)
Globulin, Total: 2.8 g/dL (ref 1.5–4.5)
Glucose: 93 mg/dL (ref 65–99)
Potassium: 5.5 mmol/L — ABNORMAL HIGH (ref 3.5–5.2)
Sodium: 140 mmol/L (ref 134–144)
Total Protein: 7.4 g/dL (ref 6.0–8.5)

## 2020-08-30 LAB — LIPID PANEL WITH LDL/HDL RATIO
Cholesterol, Total: 174 mg/dL (ref 100–199)
HDL: 74 mg/dL (ref >39)
LDL Chol Calc (NIH): 82 mg/dL (ref 0–99)
LDL/HDL Ratio: 1.1 ratio (ref 0.0–3.2)
Triglycerides: 99 mg/dL (ref 0–149)
VLDL Cholesterol Cal: 18 mg/dL (ref 5–40)

## 2020-08-30 LAB — T4, FREE: Free T4: 1.35 ng/dL (ref 0.82–1.77)

## 2020-08-30 LAB — TSH: TSH: 2.01 u[IU]/mL (ref 0.450–4.500)

## 2020-08-31 ENCOUNTER — Other Ambulatory Visit: Payer: Self-pay | Admitting: Internal Medicine

## 2020-08-31 ENCOUNTER — Encounter: Payer: Self-pay | Admitting: Internal Medicine

## 2020-08-31 ENCOUNTER — Telehealth: Payer: Self-pay

## 2020-08-31 DIAGNOSIS — E039 Hypothyroidism, unspecified: Secondary | ICD-10-CM

## 2020-08-31 LAB — UA/M W/RFLX CULTURE, ROUTINE
Bilirubin, UA: NEGATIVE
Glucose, UA: NEGATIVE
Ketones, UA: NEGATIVE
Nitrite, UA: NEGATIVE
Protein,UA: NEGATIVE
RBC, UA: NEGATIVE
Specific Gravity, UA: 1.022 (ref 1.005–1.030)
Urobilinogen, Ur: 0.2 mg/dL (ref 0.2–1.0)
pH, UA: 5.5 (ref 5.0–7.5)

## 2020-08-31 LAB — MICROSCOPIC EXAMINATION
Casts: NONE SEEN /lpf
RBC, Urine: NONE SEEN /hpf (ref 0–2)

## 2020-08-31 LAB — URINE CULTURE, REFLEX

## 2020-08-31 MED ORDER — LEVOTHYROXINE SODIUM 50 MCG PO TABS: ORAL_TABLET | ORAL | 1 refills | Status: DC

## 2020-08-31 MED ORDER — NITROFURANTOIN MONOHYD MACRO 100 MG PO CAPS: ORAL_CAPSULE | ORAL | 0 refills | Status: DC

## 2020-08-31 NOTE — Telephone Encounter (Signed)
rx

## 2020-08-31 NOTE — Progress Notes (Signed)
All Labs look good, thyroid levels are within limits as well, will continue same dose of synthroid, I sent a 90 day supply

## 2020-08-31 NOTE — Telephone Encounter (Signed)
-----   Message from Lavera Guise, MD sent at 08/31/2020  9:19 AM EST ----- All Labs look good, thyroid levels are within limits as well, will continue same dose of synthroid, I sent a 90 day supply

## 2020-08-31 NOTE — Telephone Encounter (Signed)
Spoke to pt, informed her thyroid levels are within limits and 90 day supply of synthroid was sent to the pharmacy

## 2020-09-20 ENCOUNTER — Other Ambulatory Visit: Payer: Self-pay | Admitting: Internal Medicine

## 2020-09-20 DIAGNOSIS — F411 Generalized anxiety disorder: Secondary | ICD-10-CM

## 2020-10-03 ENCOUNTER — Ambulatory Visit
Admission: RE | Admit: 2020-10-03 | Discharge: 2020-10-03 | Disposition: A | Discharge status: Home / Self Care | Payer: 59 | Source: Ambulatory Visit | Attending: Obstetrics & Gynecology | Admitting: Obstetrics & Gynecology

## 2020-10-03 ENCOUNTER — Other Ambulatory Visit: Payer: Self-pay

## 2020-10-03 DIAGNOSIS — Z1231 Encounter for screening mammogram for malignant neoplasm of breast: Secondary | ICD-10-CM | POA: Insufficient documentation

## 2020-11-15 ENCOUNTER — Ambulatory Visit: Payer: 59 | Admitting: Dermatology

## 2020-11-15 ENCOUNTER — Other Ambulatory Visit: Payer: Self-pay

## 2020-11-15 DIAGNOSIS — D224 Melanocytic nevi of scalp and neck: Secondary | ICD-10-CM

## 2020-11-15 DIAGNOSIS — L820 Inflamed seborrheic keratosis: Secondary | ICD-10-CM | POA: Diagnosis not present

## 2020-11-15 DIAGNOSIS — D489 Neoplasm of uncertain behavior, unspecified: Secondary | ICD-10-CM

## 2020-11-15 DIAGNOSIS — B351 Tinea unguium: Secondary | ICD-10-CM | POA: Diagnosis not present

## 2020-11-15 MED ORDER — TERBINAFINE HCL 250 MG PO TABS: 250.0000 mg | ORAL_TABLET | Freq: Every day | ORAL | 0 refills | Status: DC

## 2020-11-15 NOTE — Patient Instructions (Addendum)
Wound Care Instructions  1. Cleanse wound gently with soap and water once a day then pat dry with clean gauze. Apply a thing coat of Petrolatum (petroleum jelly, "Vaseline") over the wound (unless you have an allergy to this). We recommend that you use a new, sterile tube of Vaseline. Do not pick or remove scabs. Do not remove the yellow or white "healing tissue" from the base of the wound.  2. Cover the wound with fresh, clean, nonstick gauze and secure with paper tape. You may use Band-Aids in place of gauze and tape if the would is small enough, but would recommend trimming much of the tape off as there is often too much. Sometimes Band-Aids can irritate the skin.  3. You should call the office for your biopsy report after 1 week if you have not already been contacted.  4. If you experience any problems, such as abnormal amounts of bleeding, swelling, significant bruising, significant pain, or evidence of infection, please call the office immediately.  5. FOR ADULT SURGERY PATIENTS: If you need something for pain relief you may take 1 extra strength Tylenol (acetaminophen) AND 2 Ibuprofen (200mg  each) together every 4 hours as needed for pain. (do not take these if you are allergic to them or if you have a reason you should not take them.) Typically, you may only need pain medication for 1 to 3 days.    Cryotherapy Aftercare  . Wash gently with soap and water everyday.   Marland Kitchen Apply Vaseline and Band-Aid daily until healed.  Terbinafine Counseling  Terbinafine is an anti-fungal medicine that can be applied to the skin (over the counter) or taken by mouth (prescription) to treat fungal infections. The pill version is often used to treat fungal infections of the nails or scalp. While most people do not have any side effects from taking terbinafine pills, some possible side effects of the medicine can include taste changes, headache, loss of smell, vision changes, nausea, vomiting, or diarrhea.    Rare side effects can include irritation of the liver, allergic reaction, or decrease in blood counts (which may show up as not feeling well or developing an infection). If you are concerned about any of these side effects, please stop the medicine and call your doctor, or in the case of an emergency such as feeling very unwell, seek immediate medical care.    If you have any questions or concerns for your doctor, please call our main line at [phone] and press option 4 to reach your doctor's medical assistant. If no one answers, please leave a voicemail as directed and we will return your call as soon as possible. Messages left after 4 pm will be answered the following business day.   You may also send [place] a message via [place]. We typically respond to MyChart messages within 1-2 business days.  For prescription refills, please ask your pharmacy to contact our office. Our fax number is [phone].  If you have an urgent issue when the clinic is closed that cannot wait until the next business day, you can page your doctor at the number below.    Please note that while we do our best to be available for urgent issues outside of office hours, we are not available 24/7.   If you have an urgent issue and are unable to reach [place], you may choose to seek medical care at your doctor's office, retail clinic, urgent care center, or emergency room.  If you have a medical emergency,  please immediately call 911 or go to the emergency department.  Pager Numbers  - Dr. Nehemiah Massed: [phone]  - Dr. Laurence Ferrari: [phone]  - Dr. Nicole Kindred: [phone]  In the event of inclement weather, please call our main line at [phone] for an update on the status of any delays or closures.  Dermatology Medication Tips: Please keep the boxes that topical medications come in in order to help keep track of the instructions about where and how to use these. Pharmacies typically print the medication instructions  only on the boxes and not directly on the medication tubes.   If your medication is too expensive, please contact our office at [phone] option 4 or send [place] a message through [place].   We are unable to tell what your co-pay for medications will be in advance as this is different depending on your insurance coverage. However, we may be able to find a substitute medication at lower cost or fill out paperwork to get insurance to cover a needed medication.   If a prior authorization is required to get your medication covered by your insurance company, please allow [place] 1-2 business days to complete this process.  Drug prices often vary depending on where the prescription is filled and some pharmacies may offer cheaper prices.  The website www.goodrx.com contains coupons for medications through different pharmacies. The prices here do not account for what the cost may be with help from insurance (it may be cheaper with your insurance), but the website can give you the price if you did not use any insurance.  - You can print the associated coupon and take it with your prescription to the pharmacy.  - You may also stop by our office during regular business hours and pick up a GoodRx coupon card.  - If you need your prescription sent electronically to a different pharmacy, notify our office through [facility] or by phone at [phone] option 4.

## 2020-11-15 NOTE — Progress Notes (Signed)
Follow-Up Visit   Subjective  Tiffany Frey is a 45 y.o. female who presents for the following: Spots (Patient has a growth on her left upper chest that gets irritated and rubbed by clothing. She also has a smaller spot on her left upper neck to check. ) and Nail Problem (Patient has been treating toenails for onychomycosis. All have improved, but still a little discolored on left great toe. She is still using Jublia.). Pt stopped fluconazole after 6 months of treatment.  No side effects.  The following portions of the chart were reviewed this encounter and updated as appropriate:       Review of Systems:  No other skin or systemic complaints except as noted in HPI or Assessment and Plan.  Objective  Well appearing patient in no apparent distress; mood and affect are within normal limits.  A focused examination was performed including face, neck, toenails. Relevant physical exam findings are noted in the Assessment and Plan.  Objective  Left Great Toenail: Focal white discoloration, mid nail plate with some thickening, Lt great toe, worsened when compared to baseline photos  Images    Objective  Left Upper Clavicle: Erythematous keratotic or waxy stuck-on papule    Objective  Left Neck Below Jaw: 5.76mm flesh papule.   Assessment & Plan  Onychomycosis Left Great Toenail  Worsening of L gt toe post treatment with oral fluconazole  Start terbinafine 250mg  take 1 po qd with food dsp #30 0Rf  Baseline LFTs wnl from 08/29/20.  Terbinafine Counseling  Terbinafine is an anti-fungal medicine that can be applied to the skin (over the counter) or taken by mouth (prescription) to treat fungal infections. The pill version is often used to treat fungal infections of the nails or scalp. While most people do not have any side effects from taking terbinafine pills, some possible side effects of the medicine can include taste changes, headache, loss of smell, vision changes,  nausea, vomiting, or diarrhea.   Rare side effects can include irritation of the liver, allergic reaction, or decrease in blood counts (which may show up as not feeling well or developing an infection). If you are concerned about any of these side effects, please stop the medicine and call your doctor, or in the case of an emergency such as feeling very unwell, seek immediate medical care.    terbinafine (LAMISIL) 250 MG tablet - Left Great Toenail  Inflamed seborrheic keratosis Left Upper Clavicle  Prior to procedure, discussed risks of blister formation, small wound, skin dyspigmentation, or rare scar following cryotherapy.    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  Epidermal / dermal shaving  Lesion diameter (cm):  0.5 Informed consent: discussed and consent obtained   Timeout: patient name, date of birth, surgical site, and procedure verified   Procedure prep:  Patient was prepped and draped in usual sterile fashion Prep type:  Isopropyl alcohol Anesthesia: the lesion was anesthetized in a standard fashion   Anesthetic:  0.5% bupivicaine w/ epinephrine 1-100,000 local infiltration Instrument used: flexible razor blade   Hemostasis achieved with: pressure, aluminum chloride and electrodesiccation   Outcome: patient tolerated procedure well  Post-procedure details: wound care instructions given   Post-procedure details comment:  Ointment and small bandage applied  Specimen 1 - Surgical pathology Differential Diagnosis: Irritated Nevus vs other Check Margins: No 5.40mm flesh papule.    Return in about 1  month (around 12/16/2020) for toenails.   IJamesetta Orleans, CMA, am acting as scribe for [name], MD .  Documentation: I have reviewed the above documentation for accuracy and completeness, and I agree with the above.  [name] MD

## 2020-11-20 ENCOUNTER — Telehealth: Payer: Self-pay

## 2020-11-20 NOTE — Telephone Encounter (Signed)
Left pt message advising bx results./sh

## 2020-11-20 NOTE — Telephone Encounter (Signed)
-----   Message from [name], MD sent at 11/20/2020  8:20 AM EDT ----- Skin , left neck below jaw MELANOCYTIC NEVUS, INTRADERMAL TYPE, IRRITATED  Benign mole, irritated

## 2021-01-03 ENCOUNTER — Ambulatory Visit: Payer: 59 | Admitting: Dermatology

## 2021-03-20 ENCOUNTER — Other Ambulatory Visit: Payer: Self-pay | Admitting: Internal Medicine

## 2021-03-20 DIAGNOSIS — F411 Generalized anxiety disorder: Secondary | ICD-10-CM

## 2021-04-03 ENCOUNTER — Other Ambulatory Visit: Payer: Self-pay | Admitting: Internal Medicine

## 2021-04-03 DIAGNOSIS — E039 Hypothyroidism, unspecified: Secondary | ICD-10-CM

## 2021-07-03 ENCOUNTER — Other Ambulatory Visit: Payer: Self-pay | Admitting: Internal Medicine

## 2021-07-03 DIAGNOSIS — E039 Hypothyroidism, unspecified: Secondary | ICD-10-CM

## 2021-09-04 ENCOUNTER — Ambulatory Visit (INDEPENDENT_AMBULATORY_CARE_PROVIDER_SITE_OTHER): Payer: Managed Care, Other (non HMO) | Admitting: Internal Medicine

## 2021-09-04 ENCOUNTER — Encounter: Payer: Self-pay | Admitting: Internal Medicine

## 2021-09-04 ENCOUNTER — Other Ambulatory Visit: Payer: Self-pay

## 2021-09-04 DIAGNOSIS — Z1239 Encounter for other screening for malignant neoplasm of breast: Secondary | ICD-10-CM

## 2021-09-04 DIAGNOSIS — R300 Dysuria: Secondary | ICD-10-CM

## 2021-09-04 DIAGNOSIS — F411 Generalized anxiety disorder: Secondary | ICD-10-CM | POA: Diagnosis not present

## 2021-09-04 DIAGNOSIS — Z0001 Encounter for general adult medical examination with abnormal findings: Secondary | ICD-10-CM | POA: Diagnosis not present

## 2021-09-04 DIAGNOSIS — E039 Hypothyroidism, unspecified: Secondary | ICD-10-CM

## 2021-09-04 MED ORDER — LEVOTHYROXINE SODIUM 50 MCG PO TABS: ORAL_TABLET | ORAL | 3 refills | Status: DC

## 2021-09-04 MED ORDER — ALPRAZOLAM 0.25 MG PO TABS: 0.2500 mg | ORAL_TABLET | Freq: Two times a day (BID) | ORAL | 0 refills | Status: DC | PRN

## 2021-09-04 NOTE — Progress Notes (Signed)
[facility] [place], [place] [address]  Internal MEDICINE  Office Visit Note  Patient Name: Tiffany Frey  [id number]  [id number]  Date of Service: 09/14/2021  Chief Complaint  Patient presents with   Annual Exam    partial hysterectomy, still has overies   Hypothyroidism   Quality Metric Gaps    Hep C screen. Colonoscopy     HPI Pt is here for routine health maintenance examination Patient is feeling fairly well, denies any medical problems has been treated general anxiety with mild depression with Lexapro and as needed alprazolam she is very compliant with her medication continues to take Synthroid for hypothyroidism as well Patient does see OB/GYN had hysterectomy few years ago does have her ovaries she is not suffering from any menopausal symptoms at this point she wants to hold on her colonoscopy at the moment no risk factors for early screening at this point Current Medication: Outpatient Encounter Medications as of 09/04/2021  Medication Sig   escitalopram (LEXAPRO) 10 MG tablet Take 1/2 (one-half) tablet by mouth once daily   [DISCONTINUED] ALPRAZolam (XANAX) 0.25 MG tablet Take 1 tablet (0.25 mg total) by mouth 2 (two) times daily as needed for anxiety.   [DISCONTINUED] levothyroxine (SYNTHROID) 50 MCG tablet TAKE 1 TABLET BY MOUTH ONCE DAILY IN THE MORNING 30 MINUTES BEFORE BREAKFAST   ALPRAZolam (XANAX) 0.25 MG tablet Take 1 tablet (0.25 mg total) by mouth 2 (two) times daily as needed for anxiety.   levothyroxine (SYNTHROID) 50 MCG tablet TAKE 1 TABLET BY MOUTH ONCE DAILY IN THE MORNING 30 MINUTES BEFORE BREAKFAST   [DISCONTINUED] Efinaconazole (JUBLIA) 10 % SOLN Apply 1 application topically at bedtime. (Patient not taking: Reported on 09/04/2021)   [DISCONTINUED] nitrofurantoin, macrocrystal-monohydrate, (MACROBID) 100 MG capsule Take one tab po bid for UTI (Patient not taking: Reported on 09/04/2021)   [DISCONTINUED] terbinafine (LAMISIL)  250 MG tablet Take 1 tablet (250 mg total) by mouth daily. Take with food. (Patient not taking: Reported on 09/04/2021)   No facility-administered encounter medications on file as of 09/04/2021.    Surgical History: Past Surgical History:  Procedure Laterality Date   ABDOMINAL HYSTERECTOMY     CYSTOSCOPY  04/06/2020   Procedure: CYSTOSCOPY;  Surgeon: Gae Dry, MD;  Location: [facility] ORS;  Service: Gynecology;;   LAPAROSCOPY     MYOMECTOMY ABDOMINAL APPROACH     TOTAL LAPAROSCOPIC HYSTERECTOMY WITH SALPINGECTOMY Bilateral 04/06/2020   Procedure: TOTAL LAPAROSCOPIC HYSTERECTOMY WITH SALPINGECTOMY;  Surgeon: Gae Dry, MD;  Location: [facility] ORS;  Service: Gynecology;  Laterality: Bilateral;   UTERINE FIBROID EMBOLIZATION      Medical History: Past Medical History:  Diagnosis Date   Anxiety    Fibroids    Hx of dysplastic nevus 11/18/2018   L low back paraspinal above sacral area   Hypothyroidism    Spontaneous abortion    Thyroid disease     Family History: Family History  Problem Relation Age of Onset   COPD Father    Healthy Mother    Heart attack Paternal Grandfather 22   Breast cancer Neg Hx     Social History: Social History   Socioeconomic History   Marital status: Married    Spouse name: Not on file   Number of children: 0   Years of education: Not on file   Highest education level: Bachelor's degree (e.g., BA, AB, BS)  Occupational History   Not on file  Tobacco Use   Smoking status: Never  Smokeless tobacco: Never  Vaping Use   Vaping Use: Never used  Substance and Sexual Activity   Alcohol use: Yes    Alcohol/week: 1.0 - 2.0 standard drink    Types: 1 - 2 Cans of beer per week    Comment: 1-2 x per month   Drug use: No   Sexual activity: Not Currently  Other Topics Concern   Not on file  Social History Narrative   Not on file   Social Determinants of Health   Financial Resource Strain: Not on file  Food Insecurity: Not on file   Transportation Needs: Not on file  Physical Activity: Not on file  Stress: Not on file  Social Connections: Not on file      Review of Systems  Constitutional:  Negative for chills, fatigue and unexpected weight change.  HENT:  Positive for postnasal drip. Negative for congestion, rhinorrhea, sneezing and sore throat.   Eyes:  Negative for redness.  Respiratory:  Negative for cough, chest tightness and shortness of breath.   Cardiovascular:  Negative for chest pain and palpitations.  Gastrointestinal:  Negative for abdominal pain, constipation, diarrhea, nausea and vomiting.  Genitourinary:  Negative for dysuria and frequency.  Musculoskeletal:  Negative for arthralgias, back pain, joint swelling and neck pain.  Skin:  Negative for rash.  Neurological: Negative.  Negative for tremors and numbness.  Hematological:  Negative for adenopathy. Does not bruise/bleed easily.  Psychiatric/Behavioral:  Negative for behavioral problems (Depression), sleep disturbance and suicidal ideas. The patient is not nervous/anxious.     Vital Signs: BP 134/84    Pulse 69    Temp 98.5 F (36.9 C)    Resp 16    Ht 5\' 6"  (1.676 m)    Wt 146 lb 9.6 oz (66.5 kg)    SpO2 100%    BMI 23.66 kg/m    Physical Exam Constitutional:      Appearance: Normal appearance.  HENT:     Head: Normocephalic and atraumatic.     Right Ear: Tympanic membrane normal.     Left Ear: Tympanic membrane normal.     Nose: Nose normal.     Mouth/Throat:     Mouth: Mucous membranes are moist.     Pharynx: No posterior oropharyngeal erythema.  Eyes:     Extraocular Movements: Extraocular movements intact.     Conjunctiva/sclera: Conjunctivae normal.     Pupils: Pupils are equal, round, and reactive to light.  Cardiovascular:     Pulses: Normal pulses.     Heart sounds: Normal heart sounds.  Pulmonary:     Effort: Pulmonary effort is normal.     Breath sounds: Normal breath sounds.  Chest:  Breasts:    Right: Normal.      Left: Normal.  Musculoskeletal:        General: Normal range of motion.     Cervical back: Normal range of motion and neck supple.  Skin:    General: Skin is warm.  Neurological:     General: No focal deficit present.     Mental Status: She is alert.     Coordination: Coordination normal.  Psychiatric:        Mood and Affect: Mood normal.        Behavior: Behavior normal.    Assessment/Plan: 1. Encounter for general adult medical examination with abnormal findings We will update all her labs today, will schedule colonoscopy next year or Cologuard since patient does not have any risk factors - CBC  with Differential/Platelet - Lipid Panel With LDL/HDL Ratio - TSH - T4, free - Comprehensive metabolic panel  2. Hypothyroidism, unspecified type Continue Synthroid as before - TSH - T4, free   3. Breast screening - MM 3D SCREEN BREAST BILATERAL; Future  4. GAD (generalized anxiety disorder) Continue Lexapro as before - ALPRAZolam (XANAX) 0.25 MG tablet; Take 1 tablet (0.25 mg total) by mouth 2 (two) times daily as needed for anxiety.  Dispense: 20 tablet; Refill: 0  5. Acquired hypothyroidism Continue Synthroid as before - CBC with Differential/Platelet - Lipid Panel With LDL/HDL Ratio - TSH - T4, free - Comprehensive metabolic panel - levothyroxine (SYNTHROID) 50 MCG tablet; TAKE 1 TABLET BY MOUTH ONCE DAILY IN THE MORNING 30 MINUTES BEFORE BREAKFAST  Dispense: 90 tablet; Refill: 3  6. Dysuria -UA/M w/rflx Culture, Routine - CBC with Differential/Platelet - Lipid Panel With LDL/HDL Ratio - TSH - T4, free - Comprehensive metabolic panel - Microscopic Examination - Urine Culture, Reflex   General Counseling: Ashantia verbalizes understanding of the findings of todays visit and agrees with plan of treatment. I have discussed any further diagnostic evaluation that may be needed or ordered today. We also reviewed her medications today. she has been encouraged to call the  office with any questions or concerns that should arise related to todays visit.    Counseling:  [place] Controlled Substance Database was reviewed by me.  Orders Placed This Encounter  Procedures   Microscopic Examination   Urine Culture, Reflex   MM 3D SCREEN BREAST BILATERAL   UA/M w/rflx Culture, Routine   CBC with Differential/Platelet   Lipid Panel With LDL/HDL Ratio   TSH   T4, free   Comprehensive metabolic panel    Meds ordered this encounter  Medications   levothyroxine (SYNTHROID) 50 MCG tablet    Sig: TAKE 1 TABLET BY MOUTH ONCE DAILY IN THE MORNING 30 MINUTES BEFORE BREAKFAST    Dispense:  90 tablet    Refill:  3   ALPRAZolam (XANAX) 0.25 MG tablet    Sig: Take 1 tablet (0.25 mg total) by mouth 2 (two) times daily as needed for anxiety.    Dispense:  20 tablet    Refill:  0    Total time spent:30 Minutes  Time spent includes review of chart, medications, test results, and follow up plan with the patient.     Lavera Guise, MD  Internal Medicine

## 2021-09-07 ENCOUNTER — Telehealth: Payer: Self-pay

## 2021-09-07 LAB — TSH: TSH: 2.33 u[IU]/mL (ref 0.450–4.500)

## 2021-09-07 LAB — CBC WITH DIFFERENTIAL/PLATELET
Basophils Absolute: 0.1 10*3/uL (ref 0.0–0.2)
Basos: 1 %
EOS (ABSOLUTE): 0.2 10*3/uL (ref 0.0–0.4)
Eos: 2 %
Hematocrit: 40.6 % (ref 34.0–46.6)
Hemoglobin: 13.7 g/dL (ref 11.1–15.9)
Immature Grans (Abs): 0 10*3/uL (ref 0.0–0.1)
Immature Granulocytes: 0 %
Lymphocytes Absolute: 1.4 10*3/uL (ref 0.7–3.1)
Lymphs: 20 %
MCH: 31 pg (ref 26.6–33.0)
MCHC: 33.7 g/dL (ref 31.5–35.7)
MCV: 92 fL (ref 79–97)
Monocytes Absolute: 0.5 10*3/uL (ref 0.1–0.9)
Monocytes: 7 %
Neutrophils Absolute: 4.7 10*3/uL (ref 1.4–7.0)
Neutrophils: 70 %
Platelets: 280 10*3/uL (ref 150–450)
RBC: 4.42 x10E6/uL (ref 3.77–5.28)
RDW: 12 % (ref 11.7–15.4)
WBC: 6.9 10*3/uL (ref 3.4–10.8)

## 2021-09-07 LAB — COMPREHENSIVE METABOLIC PANEL
ALT: 14 IU/L (ref 0–32)
AST: 18 IU/L (ref 0–40)
Albumin/Globulin Ratio: 1.8 (ref 1.2–2.2)
Albumin: 4.4 g/dL (ref 3.8–4.8)
Alkaline Phosphatase: 70 IU/L (ref 44–121)
BUN/Creatinine Ratio: 15 (ref 9–23)
BUN: 15 mg/dL (ref 6–24)
Bilirubin Total: 0.6 mg/dL (ref 0.0–1.2)
CO2: 23 mmol/L (ref 20–29)
Calcium: 9.7 mg/dL (ref 8.7–10.2)
Chloride: 101 mmol/L (ref 96–106)
Creatinine, Ser: 1 mg/dL (ref 0.57–1.00)
Globulin, Total: 2.5 g/dL (ref 1.5–4.5)
Glucose: 94 mg/dL (ref 70–99)
Potassium: 5 mmol/L (ref 3.5–5.2)
Sodium: 138 mmol/L (ref 134–144)
Total Protein: 6.9 g/dL (ref 6.0–8.5)
eGFR: 70 mL/min/{1.73_m2} (ref >59)

## 2021-09-07 LAB — LIPID PANEL WITH LDL/HDL RATIO
Cholesterol, Total: 170 mg/dL (ref 100–199)
HDL: 71 mg/dL (ref >39)
LDL Chol Calc (NIH): 87 mg/dL (ref 0–99)
LDL/HDL Ratio: 1.2 ratio (ref 0.0–3.2)
Triglycerides: 62 mg/dL (ref 0–149)
VLDL Cholesterol Cal: 12 mg/dL (ref 5–40)

## 2021-09-07 LAB — T4, FREE: Free T4: 1.23 ng/dL (ref 0.82–1.77)

## 2021-09-07 NOTE — Telephone Encounter (Signed)
Pt called asking questions about her labs and was concerned about the UA that reflexed to a culture.  I informed pt that we need to wait a few more days as it can take 5 days or more to get the final results back.  I also informed pt that if she is having symptoms over the weekend to call the office and we have a on call phone to leave a message and someone will get back with her ?

## 2021-09-10 ENCOUNTER — Encounter: Payer: Self-pay | Admitting: Internal Medicine

## 2021-09-10 LAB — UA/M W/RFLX CULTURE, ROUTINE
Bilirubin, UA: NEGATIVE
Glucose, UA: NEGATIVE
Ketones, UA: NEGATIVE
Nitrite, UA: NEGATIVE
Protein,UA: NEGATIVE
RBC, UA: NEGATIVE
Specific Gravity, UA: 1.01 (ref 1.005–1.030)
Urobilinogen, Ur: 0.2 mg/dL (ref 0.2–1.0)
pH, UA: 6 (ref 5.0–7.5)

## 2021-09-10 LAB — MICROSCOPIC EXAMINATION
Casts: NONE SEEN /lpf
Epithelial Cells (non renal): >10 /hpf — AB (ref 0–10)
RBC, Urine: NONE SEEN /hpf (ref 0–2)

## 2021-09-10 LAB — URINE CULTURE, REFLEX

## 2021-09-11 ENCOUNTER — Telehealth: Payer: Self-pay

## 2021-09-11 ENCOUNTER — Other Ambulatory Visit: Payer: Self-pay

## 2021-09-11 DIAGNOSIS — R300 Dysuria: Secondary | ICD-10-CM

## 2021-09-11 NOTE — Telephone Encounter (Signed)
Spoke to pt and informed her that we sent a lab order for a UA to [facility] and explained to pt how to do a clean catch UA.  Also explained to pt how she needs to wipe after urinating and with a bowel movement.  Pt understood. ?

## 2021-09-16 LAB — MICROSCOPIC EXAMINATION
Bacteria, UA: NONE SEEN
Casts: NONE SEEN /lpf
RBC, Urine: NONE SEEN /hpf (ref 0–2)

## 2021-09-16 LAB — UA/M W/RFLX CULTURE, ROUTINE
Bilirubin, UA: NEGATIVE
Glucose, UA: NEGATIVE
Ketones, UA: NEGATIVE
Nitrite, UA: NEGATIVE
Protein,UA: NEGATIVE
RBC, UA: NEGATIVE
Specific Gravity, UA: 1.021 (ref 1.005–1.030)
Urobilinogen, Ur: 0.2 mg/dL (ref 0.2–1.0)
pH, UA: 5.5 (ref 5.0–7.5)

## 2021-09-16 LAB — URINE CULTURE, REFLEX

## 2021-09-19 ENCOUNTER — Encounter: Payer: Self-pay | Admitting: Internal Medicine

## 2021-09-19 ENCOUNTER — Telehealth: Payer: Self-pay

## 2021-09-19 ENCOUNTER — Other Ambulatory Visit: Payer: Self-pay

## 2021-09-19 MED ORDER — NITROFURANTOIN MONOHYD MACRO 100 MG PO CAPS: 100.0000 mg | ORAL_CAPSULE | Freq: Two times a day (BID) | ORAL | 0 refills | Status: DC

## 2021-09-19 NOTE — Telephone Encounter (Signed)
As per dr Humphrey Rolls send macrobid for uti  ?

## 2021-10-18 ENCOUNTER — Ambulatory Visit
Admission: RE | Admit: 2021-10-18 | Discharge: 2021-10-18 | Disposition: A | Discharge status: Home / Self Care | Payer: Managed Care, Other (non HMO) | Source: Ambulatory Visit | Attending: Internal Medicine | Admitting: Internal Medicine

## 2021-10-18 DIAGNOSIS — Z1231 Encounter for screening mammogram for malignant neoplasm of breast: Secondary | ICD-10-CM | POA: Insufficient documentation

## 2021-10-18 DIAGNOSIS — Z1239 Encounter for other screening for malignant neoplasm of breast: Secondary | ICD-10-CM

## 2022-03-19 ENCOUNTER — Other Ambulatory Visit: Payer: Self-pay | Admitting: Internal Medicine

## 2022-03-19 DIAGNOSIS — F411 Generalized anxiety disorder: Secondary | ICD-10-CM

## 2022-05-24 ENCOUNTER — Telehealth: Payer: Self-pay | Admitting: Internal Medicine

## 2022-05-24 NOTE — Telephone Encounter (Signed)
Lvm and sent mychart message notifying patient that 09/10/22 appointment has been moved to same day with AA-tToni

## 2022-08-27 ENCOUNTER — Encounter: Payer: Managed Care, Other (non HMO) | Admitting: Internal Medicine

## 2022-09-03 ENCOUNTER — Telehealth: Payer: Self-pay

## 2022-09-03 NOTE — Telephone Encounter (Signed)
Pt did labs with her insurance as per dfk we will discuss at visit for further treatment

## 2022-09-10 ENCOUNTER — Encounter: Payer: Managed Care, Other (non HMO) | Admitting: Internal Medicine

## 2022-09-10 ENCOUNTER — Encounter: Payer: Managed Care, Other (non HMO) | Admitting: Nurse Practitioner

## 2022-09-10 ENCOUNTER — Encounter: Payer: Self-pay | Admitting: Internal Medicine

## 2022-09-10 ENCOUNTER — Ambulatory Visit (INDEPENDENT_AMBULATORY_CARE_PROVIDER_SITE_OTHER): Payer: Managed Care, Other (non HMO) | Admitting: Internal Medicine

## 2022-09-10 VITALS — BP 152/82 | HR 66 | Temp 98.4°F | Resp 16 | Ht 66.0 in | Wt 148.0 lb

## 2022-09-10 DIAGNOSIS — E039 Hypothyroidism, unspecified: Secondary | ICD-10-CM | POA: Diagnosis not present

## 2022-09-10 DIAGNOSIS — Z0001 Encounter for general adult medical examination with abnormal findings: Secondary | ICD-10-CM | POA: Diagnosis not present

## 2022-09-10 DIAGNOSIS — R300 Dysuria: Secondary | ICD-10-CM

## 2022-09-10 DIAGNOSIS — Z1231 Encounter for screening mammogram for malignant neoplasm of breast: Secondary | ICD-10-CM

## 2022-09-10 DIAGNOSIS — F411 Generalized anxiety disorder: Secondary | ICD-10-CM | POA: Diagnosis not present

## 2022-09-10 DIAGNOSIS — Z1211 Encounter for screening for malignant neoplasm of colon: Secondary | ICD-10-CM | POA: Diagnosis not present

## 2022-09-10 MED ORDER — ESCITALOPRAM OXALATE 10 MG PO TABS: ORAL_TABLET | ORAL | 3 refills | Status: DC

## 2022-09-10 MED ORDER — LEVOTHYROXINE SODIUM 50 MCG PO TABS: ORAL_TABLET | ORAL | 3 refills | Status: DC

## 2022-09-10 MED ORDER — ALPRAZOLAM 0.25 MG PO TABS: 0.2500 mg | ORAL_TABLET | Freq: Two times a day (BID) | ORAL | 0 refills | Status: DC | PRN

## 2022-09-10 NOTE — Progress Notes (Signed)
[facility] [place], [place] [address]  Internal MEDICINE  Office Visit Note  Patient Name: Tiffany Frey  [id number]  PO:[id number]  Date of Service: 10/01/2022  Chief Complaint  Patient presents with   Annual Exam   Quality Metric Gaps    Colonoscopy     HPI Pt is here for routine health maintenance examination She does not have any complaints. Patient is feeling fairly well, denies any medical problems has been treated general anxiety with mild depression with Lexapro and as needed alprazolam she is very compliant with her medication continues to take Synthroid for hypothyroidism as well Patient does see OB/GYN had hysterectomy few years ago does have her ovaries she is not suffering from any menopausal symptoms at this point Current Medication: Outpatient Encounter Medications as of 09/10/2022  Medication Sig   [DISCONTINUED] ALPRAZolam (XANAX) 0.25 MG tablet Take 1 tablet (0.25 mg total) by mouth 2 (two) times daily as needed for anxiety.   [DISCONTINUED] escitalopram (LEXAPRO) 10 MG tablet Take 1/2 (one-half) tablet by mouth once daily   [DISCONTINUED] levothyroxine (SYNTHROID) 50 MCG tablet TAKE 1 TABLET BY MOUTH ONCE DAILY IN THE MORNING 30 MINUTES BEFORE BREAKFAST   [DISCONTINUED] nitrofurantoin, macrocrystal-monohydrate, (MACROBID) 100 MG capsule Take 1 capsule (100 mg total) by mouth 2 (two) times daily.   ALPRAZolam (XANAX) 0.25 MG tablet Take 1 tablet (0.25 mg total) by mouth 2 (two) times daily as needed for anxiety.   escitalopram (LEXAPRO) 10 MG tablet Take 1/2 (one-half) tablet by mouth once daily   levothyroxine (SYNTHROID) 50 MCG tablet TAKE 1 TABLET BY MOUTH ONCE DAILY IN THE MORNING 30 MINUTES BEFORE BREAKFAST   No facility-administered encounter medications on file as of 09/10/2022.    Surgical History: Past Surgical History:  Procedure Laterality Date   ABDOMINAL HYSTERECTOMY     CYSTOSCOPY  04/06/2020   Procedure: CYSTOSCOPY;   Surgeon: Gae Dry, MD;  Location: [facility] ORS;  Service: Gynecology;;   LAPAROSCOPY     MYOMECTOMY ABDOMINAL APPROACH     TOTAL LAPAROSCOPIC HYSTERECTOMY WITH SALPINGECTOMY Bilateral 04/06/2020   Procedure: TOTAL LAPAROSCOPIC HYSTERECTOMY WITH SALPINGECTOMY;  Surgeon: Gae Dry, MD;  Location: [facility] ORS;  Service: Gynecology;  Laterality: Bilateral;   UTERINE FIBROID EMBOLIZATION      Medical History: Past Medical History:  Diagnosis Date   Anxiety    Fibroids    Hx of dysplastic nevus 11/18/2018   L low back paraspinal above sacral area   Hypothyroidism    Spontaneous abortion    Thyroid disease     Family History: Family History  Problem Relation Age of Onset   COPD Father    Healthy Mother    Heart attack Paternal Grandfather 60   Breast cancer Neg Hx     Social History: Social History   Socioeconomic History   Marital status: Married    Spouse name: Not on file   Number of children: 0   Years of education: Not on file   Highest education level: Bachelor's degree (e.g., BA, AB, BS)  Occupational History   Not on file  Tobacco Use   Smoking status: Never   Smokeless tobacco: Never  Vaping Use   Vaping Use: Never used  Substance and Sexual Activity   Alcohol use: Yes    Alcohol/week: 1.0 - 2.0 standard drink of alcohol    Types: 1 - 2 Cans of beer per week    Comment: 1-2 x per month   Drug use:  No   Sexual activity: Not Currently  Other Topics Concern   Not on file  Social History Narrative   Not on file   Social Determinants of Health   Financial Resource Strain: Low Risk  (08/17/2018)   Overall Financial Resource Strain (CARDIA)    Difficulty of Paying Living Expenses: Not hard at all  Food Insecurity: No Food Insecurity (08/17/2018)   Hunger Vital Sign    Worried About Running Out of Food in the Last Year: Never true    Ran Out of Food in the Last Year: Never true  Transportation Needs: No Transportation Needs (08/17/2018)   PRAPARE -  Hydrologist (Medical): No    Lack of Transportation (Non-Medical): No  Physical Activity: Sufficiently Active (08/17/2018)   Exercise Vital Sign    Days of Exercise per Week: 5 days    Minutes of Exercise per Session: 40 min  Stress: Stress Concern Present (08/17/2018)   [place]    Feeling of Stress : To some extent  Social Connections: Not on file      Review of Systems  Constitutional:  Negative for chills, fatigue and unexpected weight change.  HENT:  Positive for postnasal drip. Negative for congestion, rhinorrhea, sneezing and sore throat.   Eyes:  Negative for redness.  Respiratory:  Negative for cough, chest tightness and shortness of breath.   Cardiovascular:  Negative for chest pain and palpitations.  Gastrointestinal:  Negative for abdominal pain, constipation, diarrhea, nausea and vomiting.  Genitourinary:  Negative for dysuria and frequency.  Musculoskeletal:  Negative for arthralgias, back pain, joint swelling and neck pain.  Skin:  Negative for rash.  Neurological: Negative.  Negative for tremors and numbness.  Hematological:  Negative for adenopathy. Does not bruise/bleed easily.  Psychiatric/Behavioral:  Negative for behavioral problems (Depression), sleep disturbance and suicidal ideas. The patient is not nervous/anxious.      Vital Signs: BP (!) 152/82   Pulse 66   Temp 98.4 F (36.9 C)   Resp 16   Ht 5\' 6"  (1.676 m)   Wt 148 lb (67.1 kg)   SpO2 99%   BMI 23.89 kg/m    Physical Exam Constitutional:      Appearance: Normal appearance.  HENT:     Head: Normocephalic and atraumatic.     Nose: Nose normal.     Mouth/Throat:     Mouth: Mucous membranes are moist.     Pharynx: No posterior oropharyngeal erythema.  Eyes:     Extraocular Movements: Extraocular movements intact.     Pupils: Pupils are equal, round, and reactive to light.  Cardiovascular:      Pulses: Normal pulses.     Heart sounds: Normal heart sounds.  Pulmonary:     Effort: Pulmonary effort is normal.     Breath sounds: Normal breath sounds.  Neurological:     General: No focal deficit present.     Mental Status: She is alert.  Psychiatric:        Mood and Affect: Mood normal.        Behavior: Behavior normal.          Assessment/Plan:  1. Encounter for routine adult health examination with abnormal findings All PHM is current, will need BMD on next visit   2. Acquired hypothyroidism Continue Synthroid  - levothyroxine (SYNTHROID) 50 MCG tablet; TAKE 1 TABLET BY MOUTH ONCE DAILY IN THE MORNING 30 MINUTES BEFORE BREAKFAST  Dispense: 90 tablet; Refill: 3  3. Encounter for screening colonoscopy Encouraged for cologuard, ptis low risk  - Cologuard  4. Visit for screening mammogram - MM 3D SCREENING MAMMOGRAM BILATERAL BREAST; Future  5. GAD (generalized anxiety disorder) Stable on current therapy  - escitalopram (LEXAPRO) 10 MG tablet; Take 1/2 (one-half) tablet by mouth once daily  Dispense: 45 tablet; Refill: 3 - ALPRAZolam (XANAX) 0.25 MG tablet; Take 1 tablet (0.25 mg total) by mouth 2 (two) times daily as needed for anxiety.  Dispense: 20 tablet; Refill: 0  6. Dysuria - UA/M w/rflx Culture, Routine - Microscopic Examination - Urine Culture, Reflex   General Counseling: Evette verbalizes understanding of the findings of todays visit and agrees with plan of treatment. I have discussed any further diagnostic evaluation that may be needed or ordered today. We also reviewed her medications today. she has been encouraged to call the office with any questions or concerns that should arise related to todays visit.    Counseling:  [place] Controlled Substance Database was reviewed by me.  Orders Placed This Encounter  Procedures   Microscopic Examination   Urine Culture, Reflex   MM 3D SCREENING MAMMOGRAM BILATERAL BREAST   UA/M w/rflx Culture, Routine    Cologuard    Meds ordered this encounter  Medications   levothyroxine (SYNTHROID) 50 MCG tablet    Sig: TAKE 1 TABLET BY MOUTH ONCE DAILY IN THE MORNING 30 MINUTES BEFORE BREAKFAST    Dispense:  90 tablet    Refill:  3   escitalopram (LEXAPRO) 10 MG tablet    Sig: Take 1/2 (one-half) tablet by mouth once daily    Dispense:  45 tablet    Refill:  3   ALPRAZolam (XANAX) 0.25 MG tablet    Sig: Take 1 tablet (0.25 mg total) by mouth 2 (two) times daily as needed for anxiety.    Dispense:  20 tablet    Refill:  0    Total time spent:35 Minutes  Time spent includes review of chart, medications, test results, and follow up plan with the patient.     Lavera Guise, MD  Internal Medicine

## 2022-09-14 LAB — UA/M W/RFLX CULTURE, ROUTINE
Bilirubin, UA: NEGATIVE
Glucose, UA: NEGATIVE
Ketones, UA: NEGATIVE
Nitrite, UA: NEGATIVE
Protein,UA: NEGATIVE
RBC, UA: NEGATIVE
Specific Gravity, UA: <=1.005 — AB (ref 1.005–1.030)
Urobilinogen, Ur: 0.2 mg/dL (ref 0.2–1.0)
pH, UA: 6.5 (ref 5.0–7.5)

## 2022-09-14 LAB — MICROSCOPIC EXAMINATION
Bacteria, UA: NONE SEEN
Casts: NONE SEEN /lpf
RBC, Urine: NONE SEEN /hpf (ref 0–2)

## 2022-09-14 LAB — URINE CULTURE, REFLEX

## 2022-09-19 ENCOUNTER — Encounter: Payer: Self-pay | Admitting: Internal Medicine

## 2022-09-19 NOTE — Telephone Encounter (Signed)
Pt advised that urine culture is negative   as per Tiffany Frey no treatment required

## 2022-09-19 NOTE — Telephone Encounter (Signed)
Lmom to call me back. 

## 2022-09-24 LAB — COLOGUARD: COLOGUARD: NEGATIVE

## 2022-10-01 ENCOUNTER — Encounter: Payer: Managed Care, Other (non HMO) | Admitting: Internal Medicine

## 2022-10-03 ENCOUNTER — Other Ambulatory Visit: Payer: Self-pay

## 2022-10-03 ENCOUNTER — Telehealth: Payer: Self-pay

## 2022-10-03 MED ORDER — AMOXICILLIN-POT CLAVULANATE 875-125 MG PO TABS: 1.0000 | ORAL_TABLET | Freq: Two times a day (BID) | ORAL | 0 refills | Status: DC

## 2022-10-03 NOTE — Telephone Encounter (Signed)
Pt called that she is having sinus drainage,coughing clear mucus and going on few weeks OTC is not helping as per dr Clayborn Bigness send  her Augmentin advised her take with food and still and continue OTC Mucinex and used nasal spray

## 2022-10-22 ENCOUNTER — Ambulatory Visit
Admission: RE | Admit: 2022-10-22 | Discharge: 2022-10-22 | Disposition: A | Discharge status: Home / Self Care | Payer: Managed Care, Other (non HMO) | Source: Ambulatory Visit | Attending: Internal Medicine | Admitting: Internal Medicine

## 2022-10-22 DIAGNOSIS — Z1231 Encounter for screening mammogram for malignant neoplasm of breast: Secondary | ICD-10-CM | POA: Insufficient documentation

## 2023-07-14 ENCOUNTER — Telehealth: Payer: Self-pay | Admitting: Internal Medicine

## 2023-07-14 NOTE — Telephone Encounter (Signed)
 Lvm to move 09/16/23 appointment-Toni

## 2023-09-03 ENCOUNTER — Telehealth: Payer: Self-pay | Admitting: Internal Medicine

## 2023-09-03 NOTE — Telephone Encounter (Signed)
 Lvm & sent mychart msg to move 10/07/23 appointment-Toni

## 2023-09-16 ENCOUNTER — Encounter: Payer: Managed Care, Other (non HMO) | Admitting: Internal Medicine

## 2023-09-19 ENCOUNTER — Other Ambulatory Visit: Payer: Self-pay | Admitting: Internal Medicine

## 2023-09-19 DIAGNOSIS — E039 Hypothyroidism, unspecified: Secondary | ICD-10-CM

## 2023-09-29 ENCOUNTER — Telehealth: Payer: Self-pay | Admitting: Internal Medicine

## 2023-09-29 NOTE — Telephone Encounter (Signed)
 Lvm & sent mychart msg to move 10/14/23 appointment-Toni

## 2023-10-07 ENCOUNTER — Encounter: Payer: Managed Care, Other (non HMO) | Admitting: Internal Medicine

## 2023-10-14 ENCOUNTER — Encounter: Payer: Managed Care, Other (non HMO) | Admitting: Internal Medicine

## 2023-10-21 ENCOUNTER — Ambulatory Visit: Admitting: Internal Medicine

## 2023-10-21 VITALS — BP 110/70 | HR 60 | Temp 98.0°F | Resp 16 | Ht 66.0 in | Wt 150.0 lb

## 2023-10-21 DIAGNOSIS — E039 Hypothyroidism, unspecified: Secondary | ICD-10-CM

## 2023-10-21 DIAGNOSIS — R300 Dysuria: Secondary | ICD-10-CM

## 2023-10-21 DIAGNOSIS — Z1231 Encounter for screening mammogram for malignant neoplasm of breast: Secondary | ICD-10-CM

## 2023-10-21 DIAGNOSIS — Z0001 Encounter for general adult medical examination with abnormal findings: Secondary | ICD-10-CM | POA: Diagnosis not present

## 2023-10-21 DIAGNOSIS — F411 Generalized anxiety disorder: Secondary | ICD-10-CM

## 2023-10-21 MED ORDER — ALPRAZOLAM 0.25 MG PO TABS: 0.2500 mg | ORAL_TABLET | Freq: Two times a day (BID) | ORAL | 3 refills | Status: AC | PRN

## 2023-10-21 MED ORDER — ESCITALOPRAM OXALATE 10 MG PO TABS: ORAL_TABLET | ORAL | 3 refills | Status: AC

## 2023-10-21 NOTE — Progress Notes (Signed)
 [facility] [address] [place], [place] [address]  Internal MEDICINE  Office Visit Note  Patient Name: Tiffany Frey  [id number]  [id number]  Date of Service: 11/27/2023  Chief Complaint  Patient presents with   Annual Exam     HPI Pt is here for routine health maintenance examination Needs refills on all medications  Had hysterectomy Takes all medications as prescribed   Has questions about vitamines intake   Current Medication: Outpatient Encounter Medications as of 10/21/2023  Medication Sig   levothyroxine  (SYNTHROID ) 50 MCG tablet TAKE 1 TABLET BY MOUTH IN THE MORNING 30  MINUTES  BEFORE  BREAKFAST   [DISCONTINUED] ALPRAZolam  (XANAX ) 0.25 MG tablet Take 1 tablet (0.25 mg total) by mouth 2 (two) times daily as needed for anxiety.   [DISCONTINUED] amoxicillin -clavulanate (AUGMENTIN ) 875-125 MG tablet Take 1 tablet by mouth 2 (two) times daily.   [DISCONTINUED] escitalopram  (LEXAPRO ) 10 MG tablet Take 1/2 (one-half) tablet by mouth once daily   ALPRAZolam  (XANAX ) 0.25 MG tablet Take 1 tablet (0.25 mg total) by mouth 2 (two) times daily as needed for anxiety.   escitalopram  (LEXAPRO ) 10 MG tablet Take 1/2 (one-half) tablet by mouth once daily   No facility-administered encounter medications on file as of 10/21/2023.    Surgical History: Past Surgical History:  Procedure Laterality Date   ABDOMINAL HYSTERECTOMY     CYSTOSCOPY  04/06/2020   Procedure: CYSTOSCOPY;  Surgeon: Alben Alma, MD;  Location: [facility];  Service: Gynecology;;   LAPAROSCOPY     MYOMECTOMY ABDOMINAL APPROACH     TOTAL LAPAROSCOPIC HYSTERECTOMY WITH SALPINGECTOMY Bilateral 04/06/2020   Procedure: TOTAL LAPAROSCOPIC HYSTERECTOMY WITH SALPINGECTOMY;  Surgeon: Alben Alma, MD;  Location: [facility] OR[facility];  Service: Gynecology;  Laterality: Bilateral;   UTERINE FIBROID EMBOLIZATION      Medical History: Past Medical History:  Diagnosis Date   Anxiety    Fibroids    Hx of  dysplastic nevus 11/18/2018   L low back paraspinal above sacral area   Hypothyroidism    Spontaneous abortion    Thyroid disease     Family History: Family History  Problem Relation Age of Onset   COPD Father    Healthy Mother    Heart attack Paternal Grandfather 31   Breast cancer Neg Hx     Social History: Social History   Socioeconomic History   Marital status: Married    Spouse name: Not on file   Number of children: 0   Years of education: Not on file   Highest education level: Bachelor's degree (e.g., BA, AB, BS)  Occupational History   Not on file  Tobacco Use   Smoking status: Never   Smokeless tobacco: Never  Vaping Use   Vaping status: Never Used  Substance and Sexual Activity   Alcohol use: Yes    Alcohol/week: 1.0 - 2.0 standard drink of alcohol    Types: 1 - 2 Cans of beer per week    Comment: 1-2 x per month   Drug use: No   Sexual activity: Not Currently  Other Topics Concern   Not on file  Social History Narrative   Not on file   Social Drivers of Health   Financial Resource Strain: Low Risk  (08/17/2018)   Overall Financial Resource Strain (CARDIA)    Difficulty of Paying Living Expenses: Not hard at all  Food Insecurity: No Food Insecurity (08/17/2018)   Hunger Vital Sign    Worried About Running Out of Food  in the Last Year: Never true    Ran Out of Food in the Last Year: Never true  Transportation Needs: No Transportation Needs (08/17/2018)   PRAPARE - Administrator, Civil Service (Medical): No    Lack of Transportation (Non-Medical): No  Physical Activity: Sufficiently Active (08/17/2018)   Exercise Vital Sign    Days of Exercise per Week: 5 days    Minutes of Exercise per Session: 40 min  Stress: Stress Concern Present (08/17/2018)   [employer] of Occupational Health - Occupational Stress Questionnaire    Feeling of Stress : To some extent  Social Connections: Not on file      Review of Systems  Constitutional:   Negative for chills, fatigue and unexpected weight change.  HENT:  Positive for postnasal drip. Negative for congestion, rhinorrhea, sneezing and sore throat.   Eyes:  Negative for redness.  Respiratory:  Negative for cough, chest tightness and shortness of breath.   Cardiovascular:  Negative for chest pain and palpitations.  Gastrointestinal:  Negative for abdominal pain, constipation, diarrhea, nausea and vomiting.  Genitourinary:  Negative for dysuria and frequency.  Musculoskeletal:  Negative for arthralgias, back pain, joint swelling and neck pain.  Skin:  Negative for rash.  Neurological: Negative.  Negative for tremors and numbness.  Hematological:  Negative for adenopathy. Does not bruise/bleed easily.  Psychiatric/Behavioral:  Negative for behavioral problems (Depression), sleep disturbance and suicidal ideas. The patient is not nervous/anxious.      Vital Signs: BP 110/70   Pulse 60   Temp 98 F (36.7 C)   Resp 16   Ht 5\' 6"  (1.676 m)   Wt 150 lb (68 kg)   SpO2 100%   BMI 24.21 kg/m    Physical Exam Constitutional:      Appearance: Normal appearance.  HENT:     Head: Normocephalic and atraumatic.     Nose: Nose normal.     Mouth/Throat:     Mouth: Mucous membranes are moist.     Pharynx: No posterior oropharyngeal erythema.  Eyes:     Extraocular Movements: Extraocular movements intact.     Pupils: Pupils are equal, round, and reactive to light.  Cardiovascular:     Pulses: Normal pulses.     Heart sounds: Normal heart sounds.  Pulmonary:     Effort: Pulmonary effort is normal.     Breath sounds: Normal breath sounds.  Neurological:     General: No focal deficit present.     Mental Status: She is alert.  Psychiatric:        Mood and Affect: Mood normal.        Behavior: Behavior normal.        Assessment/Plan: 1. Encounter for general adult medical examination with abnormal findings (Primary) Updated all labs  - CBC with Differential/Platelet -  Lipid Panel With LDL/HDL Ratio - TSH - T4, free - Comprehensive metabolic panel with GFR  2. Acquired hypothyroidism Continue synthroid   - TSH - T4, free  3. Visit for screening mammogram - MM 3D SCREENING MAMMOGRAM BILATERAL BREAST; Future  4. GAD (generalized anxiety disorder) Controlled  - TSH - T4, free - Comprehensive metabolic panel with GFR - escitalopram  (LEXAPRO ) 10 MG tablet; Take 1/2 (one-half) tablet by mouth once daily  Dispense: 45 tablet; Refill: 3 - ALPRAZolam  (XANAX ) 0.25 MG tablet; Take 1 tablet (0.25 mg total) by mouth 2 (two) times daily as needed for anxiety.  Dispense: 20 tablet; Refill: 3  5. Dysuria -  UA/M w/rflx Culture, Routine - Microscopic Examination - Urine Culture, Reflex   General Counseling: Aliana verbalizes understanding of the findings of todays visit and agrees with plan of treatment. I have discussed any further diagnostic evaluation that may be needed or ordered today. We also reviewed her medications today. she has been encouraged to call the office with any questions or concerns that should arise related to todays visit.    Counseling:  [place] Controlled Substance Database was reviewed by me.  Orders Placed This Encounter  Procedures   Microscopic Examination   Urine Culture, Reflex   MM 3D SCREENING MAMMOGRAM BILATERAL BREAST   UA/M w/rflx Culture, Routine   CBC with Differential/Platelet   Lipid Panel With LDL/HDL Ratio   TSH   T4, free   Comprehensive metabolic panel with GFR    Meds ordered this encounter  Medications   escitalopram  (LEXAPRO ) 10 MG tablet    Sig: Take 1/2 (one-half) tablet by mouth once daily    Dispense:  45 tablet    Refill:  3   ALPRAZolam  (XANAX ) 0.25 MG tablet    Sig: Take 1 tablet (0.25 mg total) by mouth 2 (two) times daily as needed for anxiety.    Dispense:  20 tablet    Refill:  3    Total time spent:35 Minutes  Time spent includes review of chart, medications, test results, and follow up  plan with the patient.     Lawton Price, MD  Internal Medicine

## 2023-10-23 LAB — UA/M W/RFLX CULTURE, ROUTINE
Bilirubin, UA: NEGATIVE
Glucose, UA: NEGATIVE
Ketones, UA: NEGATIVE
Nitrite, UA: NEGATIVE
Protein,UA: NEGATIVE
RBC, UA: NEGATIVE
Specific Gravity, UA: 1.006 (ref 1.005–1.030)
Urobilinogen, Ur: 0.2 mg/dL (ref 0.2–1.0)
pH, UA: 5.5 (ref 5.0–7.5)

## 2023-10-23 LAB — MICROSCOPIC EXAMINATION
Bacteria, UA: NONE SEEN
Casts: NONE SEEN /LPF
RBC, Urine: NONE SEEN /HPF (ref 0–2)

## 2023-10-23 LAB — URINE CULTURE, REFLEX

## 2023-10-24 LAB — CBC WITH DIFFERENTIAL/PLATELET
Basophils Absolute: 0.1 10*3/uL (ref 0.0–0.2)
Basos: 2 %
EOS (ABSOLUTE): 0.3 10*3/uL (ref 0.0–0.4)
Eos: 3 %
Hematocrit: 47.1 % — ABNORMAL HIGH (ref 34.0–46.6)
Hemoglobin: 15.5 g/dL (ref 11.1–15.9)
Immature Grans (Abs): 0 10*3/uL (ref 0.0–0.1)
Immature Granulocytes: 0 %
Lymphocytes Absolute: 1.8 10*3/uL (ref 0.7–3.1)
Lymphs: 25 %
MCH: 31 pg (ref 26.6–33.0)
MCHC: 32.9 g/dL (ref 31.5–35.7)
MCV: 94 fL (ref 79–97)
Monocytes Absolute: 0.5 10*3/uL (ref 0.1–0.9)
Monocytes: 6 %
Neutrophils Absolute: 4.6 10*3/uL (ref 1.4–7.0)
Neutrophils: 64 %
Platelets: 343 10*3/uL (ref 150–450)
RBC: 5 x10E6/uL (ref 3.77–5.28)
RDW: 13.1 % (ref 11.7–15.4)
WBC: 7.3 10*3/uL (ref 3.4–10.8)

## 2023-10-24 LAB — LIPID PANEL WITH LDL/HDL RATIO
Cholesterol, Total: 200 mg/dL — ABNORMAL HIGH (ref 100–199)
HDL: 75 mg/dL (ref >39)
LDL Chol Calc (NIH): 110 mg/dL — ABNORMAL HIGH (ref 0–99)
LDL/HDL Ratio: 1.5 ratio (ref 0.0–3.2)
Triglycerides: 87 mg/dL (ref 0–149)
VLDL Cholesterol Cal: 15 mg/dL (ref 5–40)

## 2023-10-24 LAB — COMPREHENSIVE METABOLIC PANEL WITH GFR
ALT: 11 IU/L (ref 0–32)
AST: 14 IU/L (ref 0–40)
Albumin: 4.8 g/dL (ref 3.9–4.9)
Alkaline Phosphatase: 87 IU/L (ref 44–121)
BUN/Creatinine Ratio: 18 (ref 9–23)
BUN: 18 mg/dL (ref 6–24)
Bilirubin Total: 0.5 mg/dL (ref 0.0–1.2)
CO2: 24 mmol/L (ref 20–29)
Calcium: 9.8 mg/dL (ref 8.7–10.2)
Chloride: 101 mmol/L (ref 96–106)
Creatinine, Ser: 1.02 mg/dL — ABNORMAL HIGH (ref 0.57–1.00)
Globulin, Total: 2.8 g/dL (ref 1.5–4.5)
Glucose: 88 mg/dL (ref 70–99)
Potassium: 5 mmol/L (ref 3.5–5.2)
Sodium: 139 mmol/L (ref 134–144)
Total Protein: 7.6 g/dL (ref 6.0–8.5)
eGFR: 67 mL/min/{1.73_m2} (ref >59)

## 2023-10-24 LAB — T4, FREE: Free T4: 1.3 ng/dL (ref 0.82–1.77)

## 2023-10-24 LAB — TSH: TSH: 2.62 u[IU]/mL (ref 0.450–4.500)

## 2023-10-27 ENCOUNTER — Telehealth: Payer: Self-pay

## 2023-10-28 NOTE — Progress Notes (Signed)
 Please let pt know, all labs are normal with slight variation from normal, only cholesterol is a little high but not high enough to be treated, she cam watch her red meat intake for now. If she has more questions I can see her, but there is no need to worry

## 2023-11-05 ENCOUNTER — Ambulatory Visit

## 2023-11-05 ENCOUNTER — Encounter: Payer: Self-pay | Admitting: Internal Medicine

## 2023-11-05 ENCOUNTER — Ambulatory Visit
Admission: RE | Admit: 2023-11-05 | Discharge: 2023-11-05 | Disposition: A | Discharge status: Home / Self Care | Source: Ambulatory Visit | Attending: Internal Medicine | Admitting: Internal Medicine

## 2023-11-05 DIAGNOSIS — Z1231 Encounter for screening mammogram for malignant neoplasm of breast: Secondary | ICD-10-CM | POA: Diagnosis present

## 2023-11-05 DIAGNOSIS — N390 Urinary tract infection, site not specified: Secondary | ICD-10-CM

## 2023-11-05 NOTE — Progress Notes (Signed)
 Pt was here to drop urine sample due to [facility] unable to run urine culture due to they left urine sample at room temperature

## 2023-11-11 LAB — CULTURE, URINE COMPREHENSIVE

## 2023-11-11 NOTE — Telephone Encounter (Signed)
 As per dr Meredeth Stallion advised are  vitamin good

## 2023-11-11 NOTE — Telephone Encounter (Signed)
 done

## 2023-11-11 NOTE — Progress Notes (Signed)
 She can order them for one year

## 2023-12-15 ENCOUNTER — Other Ambulatory Visit: Payer: Self-pay | Admitting: Internal Medicine

## 2023-12-15 DIAGNOSIS — E039 Hypothyroidism, unspecified: Secondary | ICD-10-CM

## 2024-01-07 ENCOUNTER — Encounter: Payer: Self-pay | Admitting: Internal Medicine

## 2024-01-07 ENCOUNTER — Encounter: Payer: Self-pay | Admitting: Nurse Practitioner

## 2024-01-07 ENCOUNTER — Ambulatory Visit (INDEPENDENT_AMBULATORY_CARE_PROVIDER_SITE_OTHER): Admitting: Nurse Practitioner

## 2024-01-07 VITALS — BP 120/80 | HR 66 | Temp 98.6°F | Resp 16 | Ht 66.0 in | Wt 151.6 lb

## 2024-01-07 DIAGNOSIS — K644 Residual hemorrhoidal skin tags: Secondary | ICD-10-CM | POA: Diagnosis not present

## 2024-01-07 MED ORDER — HYDROCORTISONE ACETATE 25 MG RE SUPP: 25.0000 mg | Freq: Two times a day (BID) | RECTAL | 2 refills | Status: AC

## 2024-01-07 NOTE — Progress Notes (Signed)
 [facility] [address] [place], [place] [address]  Internal MEDICINE  Office Visit Note  Patient Name: Tiffany Frey  [id number]  [id number]  Date of Service: 01/07/2024  Chief Complaint  Patient presents with   Acute Visit   Hemorrhoids     HPI Tiffany Frey presents for an acute sick visit for possible hemorrhoid.  --noticed by patient about a week ago  --denies any blood with wiping, itching or rectal pain.  --Small, pea sized. --Has not Tried any OTC medications yet      Current Medication:  Outpatient Encounter Medications as of 01/07/2024  Medication Sig   ALPRAZolam  (XANAX ) 0.25 MG tablet Take 1 tablet (0.25 mg total) by mouth 2 (two) times daily as needed for anxiety.   escitalopram  (LEXAPRO ) 10 MG tablet Take 1/2 (one-half) tablet by mouth once daily   hydrocortisone (ANUSOL-HC) 25 MG suppository Place 1 suppository (25 mg total) rectally 2 (two) times daily.   levothyroxine  (SYNTHROID ) 50 MCG tablet TAKE 1 TABLET BY MOUTH IN THE MORNING (30  MINUTES  BEFORE  BREAKFAST)   No facility-administered encounter medications on file as of 01/07/2024.      Medical History: Past Medical History:  Diagnosis Date   Anxiety    Fibroids    Hx of dysplastic nevus 11/18/2018   L low back paraspinal above sacral area   Hypothyroidism    Spontaneous abortion    Thyroid disease      Vital Signs: BP 120/80   Pulse 66   Temp 98.6 F (37 C)   Resp 16   Ht 5' 6 (1.676 m)   Wt 151 lb 9.6 oz (68.8 kg)   SpO2 100%   BMI 24.47 kg/m    Review of Systems  Constitutional:  Negative for fatigue.  Respiratory: Negative.  Negative for cough, chest tightness, shortness of breath and wheezing.   Cardiovascular: Negative.  Negative for chest pain and palpitations.  Gastrointestinal:  Negative for abdominal distention, anal bleeding, blood in stool, constipation, diarrhea and rectal pain.       Felt something around her anus and said it is uncomfortable but not  painful, thinks it is a hemorrhoid.     Physical Exam Vitals reviewed.  Constitutional:      Appearance: Normal appearance.  HENT:     Head: Normocephalic and atraumatic.  Eyes:     Pupils: Pupils are equal, round, and reactive to light.  Cardiovascular:     Rate and Rhythm: Normal rate and regular rhythm.  Pulmonary:     Effort: Pulmonary effort is normal. No respiratory distress.  Genitourinary:    Rectum: External hemorrhoid present. No mass, tenderness, anal fissure or internal hemorrhoid. Normal anal tone.  Neurological:     Mental Status: She is alert.  Psychiatric:        Mood and Affect: Mood normal.        Behavior: Behavior normal.       Assessment/Plan: 1. External hemorrhoid (Primary) Try OTC topical hemorrhoid cream or ointment first, if no improvement, try the suppository - hydrocortisone (ANUSOL-HC) 25 MG suppository; Place 1 suppository (25 mg total) rectally 2 (two) times daily.  Dispense: 12 suppository; Refill: 2   General Counseling: Tiffany Frey verbalizes understanding of the findings of todays visit and agrees with plan of treatment. I have discussed any further diagnostic evaluation that may be needed or ordered today. We also reviewed her medications today. she has been encouraged to call the office with any questions or concerns  that should arise related to todays visit.    Counseling:    No orders of the defined types were placed in this encounter.   Meds ordered this encounter  Medications   hydrocortisone (ANUSOL-HC) 25 MG suppository    Sig: Place 1 suppository (25 mg total) rectally 2 (two) times daily.    Dispense:  12 suppository    Refill:  2    Please keep on file, patient will call the pharmacy when she needs it filled.    Return if symptoms worsen or fail to improve.  [place] Controlled Substance Database was reviewed by me for overdose risk score (ORS)  Time spent:20 Minutes Time spent with patient included reviewing progress notes,  labs, imaging studies, and discussing plan for follow up.   This patient was seen by Mardy Maxin, FNP-C in collaboration with Dr. Sigrid Bathe as a part of collaborative care agreement.  Aprill Banko R. Maxin, MSN, FNP-C Internal Medicine

## 2024-06-10 ENCOUNTER — Other Ambulatory Visit: Payer: Self-pay | Admitting: Internal Medicine

## 2024-06-10 DIAGNOSIS — E039 Hypothyroidism, unspecified: Secondary | ICD-10-CM

## 2024-08-04 ENCOUNTER — Other Ambulatory Visit: Payer: Self-pay

## 2024-08-04 DIAGNOSIS — E039 Hypothyroidism, unspecified: Secondary | ICD-10-CM

## 2024-08-04 MED ORDER — LEVOTHYROXINE SODIUM 50 MCG PO TABS: ORAL_TABLET | ORAL | 0 refills | Status: AC

## 2024-10-26 ENCOUNTER — Encounter: Admitting: Internal Medicine
